# Patient Record
Sex: Male | Born: 1985
Health system: Southern US, Community
[De-identification: ages and names within clinical notes are randomized; demographics above are authoritative.]

## PROBLEM LIST (undated history)

## (undated) DIAGNOSIS — F32A Depression, unspecified: Secondary | ICD-10-CM

## (undated) DIAGNOSIS — F909 Attention-deficit hyperactivity disorder, unspecified type: Secondary | ICD-10-CM

## (undated) DIAGNOSIS — T7840XA Allergy, unspecified, initial encounter: Secondary | ICD-10-CM

## (undated) DIAGNOSIS — K219 Gastro-esophageal reflux disease without esophagitis: Secondary | ICD-10-CM

## (undated) DIAGNOSIS — I10 Essential (primary) hypertension: Secondary | ICD-10-CM

## (undated) HISTORY — DX: Gastro-esophageal reflux disease without esophagitis: K21.9

## (undated) HISTORY — PX: WISDOM TOOTH EXTRACTION: SHX21

## (undated) HISTORY — DX: Attention-deficit hyperactivity disorder, unspecified type: F90.9

## (undated) HISTORY — DX: Depression, unspecified: F32.A

## (undated) HISTORY — DX: Allergy, unspecified, initial encounter: T78.40XA

---

## 2004-07-27 HISTORY — PX: WISDOM TOOTH EXTRACTION: SHX21

## 2009-01-18 ENCOUNTER — Emergency Department (HOSPITAL_COMMUNITY): Admission: EM | Admit: 2009-01-18 | Discharge: 2009-01-18 | Payer: Self-pay | Admitting: Emergency Medicine

## 2011-07-28 DIAGNOSIS — I10 Essential (primary) hypertension: Secondary | ICD-10-CM

## 2011-07-28 HISTORY — DX: Essential (primary) hypertension: I10

## 2013-10-15 ENCOUNTER — Emergency Department (HOSPITAL_COMMUNITY): Payer: BC Managed Care – PPO

## 2013-10-15 ENCOUNTER — Emergency Department (HOSPITAL_COMMUNITY)
Admission: EM | Admit: 2013-10-15 | Discharge: 2013-10-16 | Disposition: A | Discharge status: Home / Self Care | Payer: BC Managed Care – PPO | Attending: Emergency Medicine | Admitting: Emergency Medicine

## 2013-10-15 ENCOUNTER — Encounter (HOSPITAL_COMMUNITY): Payer: Self-pay | Admitting: Emergency Medicine

## 2013-10-15 DIAGNOSIS — M545 Low back pain, unspecified: Secondary | ICD-10-CM | POA: Insufficient documentation

## 2013-10-15 DIAGNOSIS — IMO0001 Reserved for inherently not codable concepts without codable children: Secondary | ICD-10-CM | POA: Insufficient documentation

## 2013-10-15 DIAGNOSIS — R209 Unspecified disturbances of skin sensation: Secondary | ICD-10-CM | POA: Insufficient documentation

## 2013-10-15 DIAGNOSIS — I10 Essential (primary) hypertension: Secondary | ICD-10-CM | POA: Insufficient documentation

## 2013-10-15 DIAGNOSIS — R071 Chest pain on breathing: Secondary | ICD-10-CM | POA: Insufficient documentation

## 2013-10-15 DIAGNOSIS — Z79899 Other long term (current) drug therapy: Secondary | ICD-10-CM | POA: Insufficient documentation

## 2013-10-15 DIAGNOSIS — R42 Dizziness and giddiness: Secondary | ICD-10-CM | POA: Insufficient documentation

## 2013-10-15 DIAGNOSIS — R0789 Other chest pain: Secondary | ICD-10-CM

## 2013-10-15 DIAGNOSIS — IMO0002 Reserved for concepts with insufficient information to code with codable children: Secondary | ICD-10-CM | POA: Insufficient documentation

## 2013-10-15 HISTORY — DX: Essential (primary) hypertension: I10

## 2013-10-15 LAB — CBC
HCT: 43.5 % (ref 39.0–52.0)
HEMOGLOBIN: 15.2 g/dL (ref 13.0–17.0)
MCH: 31 pg (ref 26.0–34.0)
MCHC: 34.9 g/dL (ref 30.0–36.0)
MCV: 88.8 fL (ref 78.0–100.0)
PLATELETS: 217 10*3/uL (ref 150–400)
RBC: 4.9 MIL/uL (ref 4.22–5.81)
RDW: 12.1 % (ref 11.5–15.5)
WBC: 7.7 10*3/uL (ref 4.0–10.5)

## 2013-10-15 LAB — BASIC METABOLIC PANEL
BUN: 14 mg/dL (ref 6–23)
CALCIUM: 9.7 mg/dL (ref 8.4–10.5)
CO2: 27 meq/L (ref 19–32)
Chloride: 104 mEq/L (ref 96–112)
Creatinine, Ser: 0.8 mg/dL (ref 0.50–1.35)
GFR calc Af Amer: >90 mL/min (ref >90)
GFR calc non Af Amer: >90 mL/min (ref >90)
GLUCOSE: 92 mg/dL (ref 70–99)
POTASSIUM: 4.2 meq/L (ref 3.7–5.3)
SODIUM: 144 meq/L (ref 137–147)

## 2013-10-15 LAB — I-STAT TROPONIN, ED: TROPONIN I, POC: 0 ng/mL (ref 0.00–0.08)

## 2013-10-15 MED ORDER — KETOROLAC TROMETHAMINE 60 MG/2ML IM SOLN
60.0000 mg | Freq: Once | INTRAMUSCULAR | Status: AC
  Administered 2013-10-15: 60 mg via INTRAMUSCULAR
  Filled 2013-10-15: qty 2

## 2013-10-15 NOTE — ED Notes (Addendum)
Patient states he was having chest pain when he arrived, states it is mostly resolved at this time. Patient states he had a simialar episode last night and has had them in the remote past. Patient states pain is right sided, radiates to R arm and leg, he describes chest pain as pinching. States the arm and leg feel numb and cool. Patient reports low back pain and shoulder pain. Patient states he had a physical in the past week and everything was normal. Patient denies any prior evaluation for chest pain.

## 2013-10-16 MED ORDER — IBUPROFEN 600 MG PO TABS: 600.0000 mg | ORAL_TABLET | Freq: Three times a day (TID) | ORAL | Status: DC

## 2013-10-16 NOTE — ED Provider Notes (Signed)
CSN: [id number]     Arrival date & time 10/15/13  2016 History   First MD Initiated Contact with Patient 10/15/13 2306     Chief Complaint  Patient presents with  . Chest Pain    yesterday last about 1 hour, tonight started @2000      (Consider location/radiation/quality/duration/timing/severity/associated sxs/prior Treatment) HPI Patient is a 28 year old male who presents with right-sided chest pain starting this evening around 8 PM. He states he's had similar pain in the past. The back pain is mostly resolved at this time. He describes the pain as "pinching". He states he became anxious and then developed right arm tingling. He's had no shortness of breath. He denies any smoking history. He has no family history of coronary artery disease or thromboembolic disease. Patient is right-hand dominant.  Past Medical History  Diagnosis Date  . Hypertension     boarderline   Past Surgical History  Procedure Laterality Date  . Wisdom tooth extraction     No family history on file. History  Substance Use Topics  . Smoking status: Never Smoker   . Smokeless tobacco: Not on file  . Alcohol Use: Yes     Comment: social    Review of Systems  Constitutional: Negative for fever and chills.  Respiratory: Negative for cough and shortness of breath.   Cardiovascular: Positive for chest pain. Negative for palpitations and leg swelling.  Gastrointestinal: Negative for nausea, vomiting and abdominal pain.  Genitourinary: Negative for dysuria and flank pain.  Musculoskeletal: Positive for back pain and myalgias. Negative for neck pain and neck stiffness.  Skin: Negative for rash and wound.  Neurological: Positive for light-headedness. Negative for weakness, numbness and headaches.  All other systems reviewed and are negative.      Allergies  Amoxicillin  Home Medications   Current Outpatient Rx  Name  Route  Sig  Dispense  Refill  . fluticasone (FLONASE) 50 MCG/ACT nasal spray   Each  Nare   Place 1 spray into both nostrils daily as needed for allergies.          Marland Kitchen. ibuprofen (ADVIL,MOTRIN) 200 MG tablet   Oral   Take 600 mg by mouth every 8 (eight) hours as needed for headache or moderate pain.         . methylphenidate (RITALIN) 10 MG tablet   Oral   Take 1 tablet by mouth 2 (two) times daily.          BP 154/93  Pulse 85  Temp(Src) 97.7 F (36.5 C) (Oral)  Resp 16  Ht 5\' 9"  (1.753 m)  Wt 188 lb (85.276 kg)  BMI 27.75 kg/m2  SpO2 99% Physical Exam  Nursing note and vitals reviewed. Constitutional: He is oriented to person, place, and time. He appears well-developed and well-nourished. No distress.  Anxious appearing  HENT:  Head: Normocephalic and atraumatic.  Mouth/Throat: Oropharynx is clear and moist.  Eyes: EOM are normal. Pupils are equal, round, and reactive to light.  Neck: Normal range of motion. Neck supple.  No meningismus  Cardiovascular: Normal rate and regular rhythm.   Pulmonary/Chest: Effort normal and breath sounds normal. No respiratory distress. He has no wheezes. He has no rales. He exhibits tenderness (chest tenderness is completely reproduced with palpation of the right side of the sternal border and right pectoralis).  Abdominal: Soft. Bowel sounds are normal. He exhibits no distension and no mass. There is no tenderness. There is no rebound and no guarding.  Musculoskeletal: Normal range of  motion. He exhibits no edema and no tenderness.  Mild bilateral paraspinal lumbar tenderness.  Neurological: He is alert and oriented to person, place, and time.  Patient is alert and oriented x3 with clear, goal oriented speech. Patient has 5/5 motor in all extremities. Sensation is intact to light touch. Patient has a normal gait and walks without assistance.   Skin: Skin is warm and dry. No rash noted. No erythema.  Psychiatric:  Anxious    ED Course  Procedures (including critical care time) Labs Review Labs Reviewed  CBC  BASIC  METABOLIC PANEL  I-STAT TROPOININ, ED   Imaging Review No results found.   EKG Interpretation   Date/Time:  Sunday October 15 2013 20:59:15 EDT Ventricular Rate:  78 PR Interval:  140 QRS Duration: 97 QT Interval:  356 QTC Calculation: 405 R Axis:   60 Text Interpretation:  Sinus rhythm Confirmed by Andree Golphin  MD, Rosemary Pentecost  ([id number]) on 10/15/2013 11:11:34 PM      MDM   Final diagnoses:  None   Patient has no significant risk factors for from embolic disease or coronary artery disease. His pain is completely reproduced with palpation of the anterior chest and the right side. We'll treat his chest wall pain with anti-inflammatories. Patient encouraged to followup with his primary doctor regarding his mild elevated blood pressure. He states it always elevates when he is in a hospital setting. Return precautions have been given the patient voiced understanding.     Loren Racer, MD 10/16/13 [phone]

## 2013-10-16 NOTE — Discharge Instructions (Signed)

## 2014-03-19 ENCOUNTER — Encounter: Payer: Self-pay | Admitting: Family Medicine

## 2014-03-19 ENCOUNTER — Ambulatory Visit (INDEPENDENT_AMBULATORY_CARE_PROVIDER_SITE_OTHER): Payer: 59 | Admitting: Family Medicine

## 2014-03-19 ENCOUNTER — Ambulatory Visit: Payer: 59 | Admitting: Family Medicine

## 2014-03-19 VITALS — BP 138/100 | HR 73 | Temp 97.9°F | Resp 16 | Ht 70.0 in | Wt 194.6 lb

## 2014-03-19 DIAGNOSIS — I781 Nevus, non-neoplastic: Secondary | ICD-10-CM

## 2014-03-19 DIAGNOSIS — L309 Dermatitis, unspecified: Secondary | ICD-10-CM

## 2014-03-19 DIAGNOSIS — I10 Essential (primary) hypertension: Secondary | ICD-10-CM

## 2014-03-19 DIAGNOSIS — Z7189 Other specified counseling: Secondary | ICD-10-CM

## 2014-03-19 DIAGNOSIS — L259 Unspecified contact dermatitis, unspecified cause: Secondary | ICD-10-CM

## 2014-03-19 DIAGNOSIS — F909 Attention-deficit hyperactivity disorder, unspecified type: Secondary | ICD-10-CM

## 2014-03-19 DIAGNOSIS — Z7689 Persons encountering health services in other specified circumstances: Secondary | ICD-10-CM

## 2014-03-19 MED ORDER — LISINOPRIL 5 MG PO TABS: 5.0000 mg | ORAL_TABLET | Freq: Every day | ORAL | Status: DC

## 2014-03-19 MED ORDER — HYDROCORTISONE 2.5 % EX OINT: TOPICAL_OINTMENT | Freq: Two times a day (BID) | CUTANEOUS | Status: DC

## 2014-03-19 MED ORDER — METHYLPHENIDATE HCL 10 MG PO TABS: 10.0000 mg | ORAL_TABLET | Freq: Two times a day (BID) | ORAL | Status: DC

## 2014-03-19 NOTE — Patient Instructions (Signed)
DASH Eating Plan °DASH stands for "Dietary Approaches to Stop Hypertension." The DASH eating plan is a healthy eating plan that has been shown to reduce high blood pressure (hypertension). Additional health benefits may include reducing the risk of type 2 diabetes mellitus, heart disease, and stroke. The DASH eating plan may also help with weight loss. °WHAT DO I NEED TO KNOW ABOUT THE DASH EATING PLAN? °For the DASH eating plan, you will follow these general guidelines: °· Choose foods with a percent daily value for sodium of less than 5% (as listed on the food label). °· Use salt-free seasonings or herbs instead of table salt or sea salt. °· Check with your health care provider or pharmacist before using salt substitutes. °· Eat lower-sodium products, often labeled as "lower sodium" or "no salt added." °· Eat fresh foods. °· Eat more vegetables, fruits, and low-fat dairy products. °· Choose whole grains. Look for the word "whole" as the first word in the ingredient list. °· Choose fish and skinless chicken or turkey more often than red meat. Limit fish, poultry, and meat to 6 oz (170 g) each day. °· Limit sweets, desserts, sugars, and sugary drinks. °· Choose heart-healthy fats. °· Limit cheese to 1 oz (28 g) per day. °· Eat more home-cooked food and less restaurant, buffet, and fast food. °· Limit fried foods. °· Cook foods using methods other than frying. °· Limit canned vegetables. If you do use them, rinse them well to decrease the sodium. °· When eating at a restaurant, ask that your food be prepared with less salt, or no salt if possible. °WHAT FOODS CAN I EAT? °Seek help from a dietitian for individual calorie needs. °Grains °Whole grain or whole wheat bread. Brown rice. Whole grain or whole wheat pasta. Quinoa, bulgur, and whole grain cereals. Low-sodium cereals. Corn or whole wheat flour tortillas. Whole grain cornbread. Whole grain crackers. Low-sodium crackers. °Vegetables °Fresh or frozen vegetables  (raw, steamed, roasted, or grilled). Low-sodium or reduced-sodium tomato and vegetable juices. Low-sodium or reduced-sodium tomato sauce and paste. Low-sodium or reduced-sodium canned vegetables.  °Fruits °All fresh, canned (in natural juice), or frozen fruits. °Meat and Other Protein Products °Ground beef (85% or leaner), grass-fed beef, or beef trimmed of fat. Skinless chicken or turkey. Ground chicken or turkey. Pork trimmed of fat. All fish and seafood. Eggs. Dried beans, peas, or lentils. Unsalted nuts and seeds. Unsalted canned beans. °Dairy °Low-fat dairy products, such as skim or 1% milk, 2% or reduced-fat cheeses, low-fat ricotta or cottage cheese, or plain low-fat yogurt. Low-sodium or reduced-sodium cheeses. °Fats and Oils °Tub margarines without trans fats. Light or reduced-fat mayonnaise and salad dressings (reduced sodium). Avocado. Safflower, olive, or canola oils. Natural peanut or almond butter. °Other °Unsalted popcorn and pretzels. °The items listed above may not be a complete list of recommended foods or beverages. Contact your dietitian for more options. °WHAT FOODS ARE NOT RECOMMENDED? °Grains °White bread. White pasta. White rice. Refined cornbread. Bagels and croissants. Crackers that contain trans fat. °Vegetables °Creamed or fried vegetables. Vegetables in a cheese sauce. Regular canned vegetables. Regular canned tomato sauce and paste. Regular tomato and vegetable juices. °Fruits °Dried fruits. Canned fruit in light or heavy syrup. Fruit juice. °Meat and Other Protein Products °Fatty cuts of meat. Ribs, chicken wings, bacon, sausage, bologna, salami, chitterlings, fatback, hot dogs, bratwurst, and packaged luncheon meats. Salted nuts and seeds. Canned beans with salt. °Dairy °Whole or 2% milk, cream, half-and-half, and cream cheese. Whole-fat or sweetened yogurt. Full-fat   cheeses or blue cheese. Nondairy creamers and whipped toppings. Processed cheese, cheese spreads, or cheese  curds. °Condiments °Onion and garlic salt, seasoned salt, table salt, and sea salt. Canned and packaged gravies. Worcestershire sauce. Tartar sauce. Barbecue sauce. Teriyaki sauce. Soy sauce, including reduced sodium. Steak sauce. Fish sauce. Oyster sauce. Cocktail sauce. Horseradish. Ketchup and mustard. Meat flavorings and tenderizers. Bouillon cubes. Hot sauce. Tabasco sauce. Marinades. Taco seasonings. Relishes. °Fats and Oils °Butter, stick margarine, lard, shortening, ghee, and bacon fat. Coconut, palm kernel, or palm oils. Regular salad dressings. °Other °Pickles and olives. Salted popcorn and pretzels. °The items listed above may not be a complete list of foods and beverages to avoid. Contact your dietitian for more information. °WHERE CAN I FIND MORE INFORMATION? °National Heart, Lung, and Blood Institute: www.nhlbi.nih.gov/health/health-topics/topics/dash/ °Document Released: 07/02/2011 Document Revised: 11/27/2013 Document Reviewed: 05/17/2013 °ExitCare® Patient Information ©2015 ExitCare, LLC. This information is not intended to replace advice given to you by your health care provider. Make sure you discuss any questions you have with your health care provider. ° °

## 2014-03-19 NOTE — Progress Notes (Signed)
Subjective:    Patient ID: Patrick Graham, male    DOB: [birth year], 28 y.o.   MRN: [id number]  03/19/2014  Establish Care  HPI This 28 y.o. male presents for to establish care.    Last physical: 12/2013 at the [facility].   Colonoscopy: never TDAP:  UTD Influenza:  never Eye exam:  None recent; 2010; +glasses. Dental exam:  Last year.  ADHD: followed by local primary care physician at [facility].  Switched back to Ritalin due to lack of insurance; Adderall was working but Ritalin was cheaper.  Considering weaning self off of medication but just started working for the [employer] for [employer] so does not want to wean medication quite yet.  Followed by Dr. Ander Slade once yearly. Diagnosed with ADHD in middle school.  Denies side effects to medication.   Denies HA, insomnia, irritability. Emotionally doing well; denies depressive symptoms or anxiety.  Does now realize that blood pressure has been elevated for the past year since pt switched to Ritalin.  Allergic Rhinitis: taking Flonase for nasal congestion to help sleep to avoid snoring.  Recent weight loss which has also helped with snoring.    Pilonydal node/cyst:  Treated with abx recently; no need for surgical removal.   Last issue 1-2 months ago.  Went to [facility] health clinic for abx/Augmentin.    R hand rash:  Chemical irritation from previous job; has chronic scaling of R hand > L hand.  Excessive washing of hands.  Also scaling of hands worsens during winter months.  No excessive sweating of hands or feet.       Nevus stomach: one day in the shower, started bleeding one year ago.  No recurrent bleeding.  No change in shape or color.  No previous dermatology evaluation.  Elevated blood pressure: has been an issue for the past year.  Ranges from 130-160/80-100.  +Headaches twice weekly on average. No blurred vision, dizziness, paresthesias, focal weakness. Denies CP/palp/SOB/leg swelling.   Review of Systems  Constitutional:  Negative for fever, chills, diaphoresis, activity change, appetite change and fatigue.  Eyes: Negative for visual disturbance.  Respiratory: Negative for cough and shortness of breath.   Cardiovascular: Negative for chest pain, palpitations and leg swelling.  Endocrine: Negative for cold intolerance, heat intolerance, polydipsia, polyphagia and polyuria.  Skin: Negative for color change, rash and wound.  Neurological: Negative for dizziness, tremors, seizures, syncope, facial asymmetry, speech difficulty, weakness, light-headedness, numbness and headaches.  Psychiatric/Behavioral: Positive for decreased concentration. Negative for suicidal ideas, sleep disturbance, self-injury and dysphoric mood. The patient is not nervous/anxious.     Past Medical History  Diagnosis Date  . Hypertension 07/28/2011    borderline  . ADHD (attention deficit hyperactivity disorder)     diagnosed in middle school.  Previous use of Concerta, Adderall, Focalin  . Allergy    Past Surgical History  Procedure Laterality Date  . Wisdom tooth extraction     Allergies  Allergen Reactions  . Amoxicillin Nausea And Vomiting   Current Outpatient Prescriptions  Medication Sig Dispense Refill  . fluticasone (FLONASE) 50 MCG/ACT nasal spray Place 1 spray into both nostrils daily as needed for allergies.       Marland Kitchen ibuprofen (ADVIL,MOTRIN) 200 MG tablet Take 600 mg by mouth every 8 (eight) hours as needed for headache or moderate pain.      . hydrocortisone 2.5 % ointment Apply topically 2 (two) times daily.  30 g  1  . ibuprofen (ADVIL,MOTRIN) 600 MG tablet  Take 1 tablet (600 mg total) by mouth 3 (three) times daily after meals.  30 tablet  0  . lisinopril (PRINIVIL,ZESTRIL) 5 MG tablet Take 1 tablet (5 mg total) by mouth daily.  30 tablet  5  . methylphenidate (RITALIN) 10 MG tablet Take 1 tablet (10 mg total) by mouth 2 (two) times daily.  60 tablet  0   No current facility-administered medications for this visit.    History   Social History  . Marital Status: Single    Spouse Name: N/A    Number of Children: N/A  . Years of Education: N/A   Occupational History  . Not on file.   Social History Main Topics  . Smoking status: Never Smoker   . Smokeless tobacco: Not on file  . Alcohol Use: Yes     Comment: social  . Drug Use: No  . Sexual Activity: Not on file   Other Topics Concern  . Not on file   Social History Narrative   Marital status: single; not dating      Children:  None      Lives: with roommates; renting.      Employment:  [employer]; started in April 2015; likes job.      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      Tobacco; none      Alcohol:  Socially      Drugs: none      Exercise: none   Family History  Problem Relation Age of Onset  . Cancer Mother     endometrial cancer        Objective:    BP 138/100  Pulse 73  Temp(Src) 97.9 F (36.6 C) (Oral)  Resp 16  Ht  (1.778 m)  Wt 194 lb 9.6 oz (88.27 kg)  BMI 27.92 kg/m2  SpO2 97% Physical Exam  Nursing note and vitals reviewed. Constitutional: He is oriented to person, place, and time. He appears well-developed and well-nourished. No distress.  HENT:  Head: Normocephalic and atraumatic.  Right Ear: External ear normal.  Left Ear: External ear normal.  Nose: Nose normal.  Mouth/Throat: Oropharynx is clear and moist.  Eyes: Conjunctivae and EOM are normal. Pupils are equal, round, and reactive to light.  Neck: Normal range of motion. Neck supple. Carotid bruit is not present. No thyromegaly present.  Cardiovascular: Normal rate, regular rhythm, normal heart sounds and intact distal pulses.  Exam reveals no gallop and no friction rub.   No murmur heard. Pulmonary/Chest: Effort normal and breath sounds normal. He has no wheezes. He has no rales.  Abdominal: Soft. Bowel sounds are normal. He exhibits no distension and no mass. There is no tenderness. There is no rebound  and no guarding.  Lymphadenopathy:    He has no cervical adenopathy.  Neurological: He is alert and oriented to person, place, and time. No cranial nerve deficit.  Skin: Skin is warm and dry. No rash noted. He is not diaphoretic.  Scattered nevi without irregularity or color variation.  Psychiatric: He has a normal mood and affect. His behavior is normal.   Results for orders placed during the hospital encounter of 10/15/13  CBC      Result Value Ref Range   WBC 7.7  4.0 - 10.5 K/uL   RBC 4.90  4.22 - 5.81 MIL/uL   Hemoglobin 15.2  13.0 - 17.0 g/dL   HCT 56.2  13.0 - 86.5 %   MCV 88.8  78.0 -  100.0 fL   MCH 31.0  26.0 - 34.0 pg   MCHC 34.9  30.0 - 36.0 g/dL   RDW 16.1  09.6 - 04.5 %   Platelets 217  150 - 400 K/uL  BASIC METABOLIC PANEL      Result Value Ref Range   Sodium 144  137 - 147 mEq/L   Potassium 4.2  3.7 - 5.3 mEq/L   Chloride 104  96 - 112 mEq/L   CO2 27  19 - 32 mEq/L   Glucose, Bld 92  70 - 99 mg/dL   BUN 14  6 - 23 mg/dL   Creatinine, Ser 4.09  0.50 - 1.35 mg/dL   Calcium 9.7  8.4 - 81.1 mg/dL   GFR calc non Af Amer >90  >90 mL/min   GFR calc Af Amer >90  >90 mL/min  I-STAT TROPOININ, ED      Result Value Ref Range   Troponin i, poc 0.00  0.00 - 0.08 ng/mL   Comment 3                Assessment & Plan:   1. Nevus, non-neoplastic   2. Essential hypertension, benign   3. Dermatitis   4. Attention deficit hyperactivity disorder (ADHD), unspecified ADHD type   5. Establishing care with new doctor, encounter for    1. ADHD: controlled; refill of Ritalin provided.  RTC one month. 2.  HTN: new onset; obtain recent labs from appointment with previous PCP in 12/2013.  Rx for Lisinopril  daily provided; obtain u/a, CMET at next visit. Follow-up in six weeks for repeat BP. 3.  Contact Dermatitis Hands B: New. Rx for hydrocortisone ointment 2.5% bid. 4.  Nevus multiple:  New. Refer to dermatology for skin survey of various nevi. 5. Establish care: reviewed health  history in detail during visit.  Meds ordered this encounter  Medications  . lisinopril (PRINIVIL,ZESTRIL) 5 MG tablet    Sig: Take 1 tablet (5 mg total) by mouth daily.    Dispense:  30 tablet    Refill:  5  . methylphenidate (RITALIN) 10 MG tablet    Sig: Take 1 tablet (10 mg total) by mouth 2 (two) times daily.    Dispense:  60 tablet    Refill:  0  . hydrocortisone 2.5 % ointment    Sig: Apply topically 2 (two) times daily.    Dispense:  30 g    Refill:  1    Return in about 6 weeks (around 04/30/2014).    Nilda Simmer, M.D.  [facility] [address] [place], [place]  [address] [phone] phone [phone] fax

## 2014-06-18 ENCOUNTER — Telehealth: Payer: Self-pay

## 2014-06-18 NOTE — Telephone Encounter (Signed)
Patient needs to pick up written prescription for Ritalin 10 mg  Please call him at [phone] when ready to pick up.

## 2014-06-19 NOTE — Telephone Encounter (Signed)
Patient was to follow-up with me in 04/2014 to follow-up on blood pressure; I do not see a scheduled appointment.

## 2014-06-19 NOTE — Telephone Encounter (Signed)
Lm for pt to rtn call to schedule appt.

## 2014-06-24 ENCOUNTER — Ambulatory Visit (INDEPENDENT_AMBULATORY_CARE_PROVIDER_SITE_OTHER): Payer: 59

## 2014-06-24 ENCOUNTER — Ambulatory Visit (INDEPENDENT_AMBULATORY_CARE_PROVIDER_SITE_OTHER): Payer: 59 | Admitting: Family Medicine

## 2014-06-24 VITALS — BP 122/80 | HR 76 | Temp 97.9°F | Resp 16 | Ht 70.0 in | Wt 200.8 lb

## 2014-06-24 DIAGNOSIS — I10 Essential (primary) hypertension: Secondary | ICD-10-CM | POA: Insufficient documentation

## 2014-06-24 DIAGNOSIS — R0781 Pleurodynia: Secondary | ICD-10-CM

## 2014-06-24 DIAGNOSIS — F901 Attention-deficit hyperactivity disorder, predominantly hyperactive type: Secondary | ICD-10-CM

## 2014-06-24 DIAGNOSIS — S20211A Contusion of right front wall of thorax, initial encounter: Secondary | ICD-10-CM

## 2014-06-24 LAB — CBC WITH DIFFERENTIAL/PLATELET
BASOS ABS: 0.1 10*3/uL (ref 0.0–0.1)
BASOS PCT: 1 % (ref 0–1)
Eosinophils Absolute: 0.9 10*3/uL — ABNORMAL HIGH (ref 0.0–0.7)
Eosinophils Relative: 14 % — ABNORMAL HIGH (ref 0–5)
HEMATOCRIT: 46.7 % (ref 39.0–52.0)
HEMOGLOBIN: 16.2 g/dL (ref 13.0–17.0)
LYMPHS PCT: 31 % (ref 12–46)
Lymphs Abs: 2 10*3/uL (ref 0.7–4.0)
MCH: 30.7 pg (ref 26.0–34.0)
MCHC: 34.7 g/dL (ref 30.0–36.0)
MCV: 88.4 fL (ref 78.0–100.0)
MONO ABS: 0.4 10*3/uL (ref 0.1–1.0)
MONOS PCT: 7 % (ref 3–12)
MPV: 8.9 fL — AB (ref 9.4–12.4)
NEUTROS ABS: 3 10*3/uL (ref 1.7–7.7)
NEUTROS PCT: 47 % (ref 43–77)
Platelets: 270 10*3/uL (ref 150–400)
RBC: 5.28 MIL/uL (ref 4.22–5.81)
RDW: 12.7 % (ref 11.5–15.5)
WBC: 6.3 10*3/uL (ref 4.0–10.5)

## 2014-06-24 LAB — COMPREHENSIVE METABOLIC PANEL
ALBUMIN: 4.6 g/dL (ref 3.5–5.2)
ALK PHOS: 61 U/L (ref 39–117)
ALT: 17 U/L (ref 0–53)
AST: 13 U/L (ref 0–37)
BILIRUBIN TOTAL: 0.5 mg/dL (ref 0.2–1.2)
BUN: 13 mg/dL (ref 6–23)
CO2: 28 mEq/L (ref 19–32)
Calcium: 9.4 mg/dL (ref 8.4–10.5)
Chloride: 104 mEq/L (ref 96–112)
Creat: 0.8 mg/dL (ref 0.50–1.35)
Glucose, Bld: 107 mg/dL — ABNORMAL HIGH (ref 70–99)
POTASSIUM: 4.2 meq/L (ref 3.5–5.3)
SODIUM: 139 meq/L (ref 135–145)
TOTAL PROTEIN: 6.9 g/dL (ref 6.0–8.3)

## 2014-06-24 LAB — TSH: TSH: 1.336 u[IU]/mL (ref 0.350–4.500)

## 2014-06-24 LAB — LIPID PANEL
CHOL/HDL RATIO: 2.7 ratio
Cholesterol: 116 mg/dL (ref 0–200)
HDL: 43 mg/dL (ref >39)
LDL CALC: 57 mg/dL (ref 0–99)
Triglycerides: 82 mg/dL (ref <150)
VLDL: 16 mg/dL (ref 0–40)

## 2014-06-24 MED ORDER — METHYLPHENIDATE HCL 10 MG PO TABS: 10.0000 mg | ORAL_TABLET | Freq: Two times a day (BID) | ORAL | Status: DC

## 2014-06-24 NOTE — Patient Instructions (Signed)
Rib Contusion °A rib contusion (bruise) can occur by a blow to the chest or by a fall against a hard object. Usually these will be much better in a couple weeks. If X-rays were taken today and there are no broken bones (fractures), the diagnosis of bruising is made. However, broken ribs may not show up for several days, or may be discovered later on a routine X-ray when signs of healing show up. If this happens to you, it does not mean that something was missed on the X-ray, but simply that it did not show up on the first X-rays. Earlier diagnosis will not usually change the treatment. °HOME CARE INSTRUCTIONS  °· Avoid strenuous activity. Be careful during activities and avoid bumping the injured ribs. Activities that pull on the injured ribs and cause pain should be avoided, if possible. °· For the first day or two, an ice pack used every 20 minutes while awake may be helpful. Put ice in a plastic bag and put a towel between the bag and the skin. °· Eat a normal, well-balanced diet. Drink plenty of fluids to avoid constipation. °· Take deep breaths several times a day to keep lungs free of infection. Try to cough several times a day. Splint the injured area with a pillow while coughing to ease pain. Coughing can help prevent pneumonia. °· Wear a rib belt or binder only if told to do so by your caregiver. If you are wearing a rib belt or binder, you must do the breathing exercises as directed by your caregiver. If not used properly, rib belts or binders restrict breathing which can lead to pneumonia. °· Only take over-the-counter or prescription medicines for pain, discomfort, or fever as directed by your caregiver. °SEEK MEDICAL CARE IF:  °· You or your child has an oral temperature above 102° F (38.9° C). °· Your baby is older than 3 months with a rectal temperature of 100.5° F (38.1° C) or higher for more than 1 day. °· You develop a cough, with thick or bloody sputum. °SEEK IMMEDIATE MEDICAL CARE IF:  °· You  have difficulty breathing. °· You feel sick to your stomach (nausea), have vomiting or belly (abdominal) pain. °· You have worsening pain, not controlled with medications, or there is a change in the location of the pain. °· You develop sweating or radiation of the pain into the arms, jaw or shoulders, or become light headed or faint. °· You or your child has an oral temperature above 102° F (38.9° C), not controlled by medicine. °· Your or your baby is older than 3 months with a rectal temperature of 102° F (38.9° C) or higher. °· Your baby is 3 months old or younger with a rectal temperature of 100.4° F (38° C) or higher. °MAKE SURE YOU:  °· Understand these instructions. °· Will watch your condition. °· Will get help right away if you are not doing well or get worse. °Document Released: 04/07/2001 Document Revised: 11/07/2012 Document Reviewed: 02/29/2008 °ExitCare® Patient Information ©2015 [employer]. This information is not intended to replace advice given to you by your health care provider. Make sure you discuss any questions you have with your health care provider. ° °

## 2014-06-24 NOTE — Progress Notes (Addendum)
Subjective:    Patient ID: Patrick Graham, male    DOB: 01/20/1986, 28 y.o.   MRN: [id number]  HPI Chief Complaint  Patient presents with   Rib Injury    x 7 days   This chart was scribed for Nilda SimmerKristi Smith, MD by Andrew Auaven Small, ED Scribe. This patient was seen in room 1 and the patient's care was started at 9:24 AM.  HPI Comments: Patrick Graham is a 28 y.o. male who presents to the [facility] complaining of a rib injury x 7 days ago. Pt established care with me in August. He was prescribed Ritalin for ADHD and was started on lisinopril 5mg   for HTN. Pt was referred to dermatology for multiple nevi and was prescribed a hydrocortisone cream for dermatitis of hands. Pt called for a refill of Ritalin on 06/18/14 but was told he had to be seen. Pt is tolerating Ritalin well and only takes medication when working. 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 Pt has been taking lisinopril and reports he is no longer having headaches. He denies checking BP at home. Pt denies exercise.  Dermatologist removed one nevi with negative pathology.  Pt c/o unchanged right rib injury that occurred 8 days ago. Pt states he dropped a hand truck on his chest and now has right rib pain worse with movement and deep breaths. Pt also has pain with sleeping.  Pt has taken 400- 800mg  ibuprofen with relief. Pt denies SOB, cough, fever, chills, sweats, abdominal pain, nausea, emesis, and hematuria.     Past Medical History  Diagnosis Date   Hypertension 07/28/2011    borderline   ADHD (attention deficit hyperactivity disorder)     diagnosed in middle school.  Previous use of Concerta, Adderall, Focalin   Allergy    Allergies  Allergen Reactions   Amoxicillin Nausea And Vomiting   Prior to Admission medications   Medication Sig Start Date End Date Taking? Authorizing Provider  fluticasone (FLONASE) 50 MCG/ACT nasal spray Place 1 spray into both nostrils  daily as needed for allergies.  09/18/13  Yes Historical Provider, MD  ibuprofen (ADVIL,MOTRIN) 200 MG tablet Take 600 mg by mouth every 8 (eight) hours as needed for headache or moderate pain.   Yes Historical Provider, MD  ibuprofen (ADVIL,MOTRIN) 600 MG tablet Take 1 tablet (600 mg total) by mouth 3 (three) times daily after meals. 10/16/13  Yes Loren Raceravid Yelverton, MD  lisinopril (PRINIVIL,ZESTRIL) 5 MG tablet Take 1 tablet (5 mg total) by mouth daily. 03/19/14  Yes Ethelda ChickKristi M Smith, MD  methylphenidate (RITALIN) 10 MG tablet Take 1 tablet (10 mg total) by mouth 2 (two) times daily. 03/19/14  Yes Ethelda ChickKristi M Smith, MD  hydrocortisone 2.5 % ointment Apply topically 2 (two) times daily. Patient not taking: Reported on 06/24/2014 03/19/14   Ethelda ChickKristi M Smith, MD   Review of Systems  Constitutional: Negative for fever, chills, diaphoresis and fatigue.  Respiratory: Negative for cough and shortness of breath.   Cardiovascular: Positive for chest pain. Negative for palpitations and leg swelling.  Gastrointestinal: Negative for nausea, vomiting and abdominal pain.  Genitourinary: Negative for hematuria.  Musculoskeletal: Positive for myalgias and arthralgias. Negative for neck pain and neck stiffness.  Skin: Negative for rash.  Neurological: Negative for dizziness, tremors, seizures, syncope, facial asymmetry, speech difficulty, weakness, light-headedness, numbness and headaches.  Psychiatric/Behavioral: Positive for decreased concentration. Negative for suicidal ideas, sleep disturbance, self-injury and dysphoric mood. The patient  is not nervous/anxious.    Objective:   Physical Exam  Constitutional: He is oriented to person, place, and time. He appears well-developed and well-nourished. No distress.  HENT:  Head: Normocephalic and atraumatic.  Right Ear: External ear normal.  Left Ear: External ear normal.  Nose: Nose normal.  Mouth/Throat: Oropharynx is clear and moist.  Eyes: Conjunctivae and EOM are  normal. Pupils are equal, round, and reactive to light.  Neck: Normal range of motion. Neck supple. Carotid bruit is not present. No thyromegaly present.  Cardiovascular: Normal rate, regular rhythm, normal heart sounds and intact distal pulses.  Exam reveals no gallop and no friction rub.   No murmur heard. Pulmonary/Chest: Effort normal and breath sounds normal. He has no wheezes. He has no rales.  +TTP right anterior ribs medially anteriorly. + Pain to anterior ribs with ROM of lumbar spine.   Abdominal: Soft. Bowel sounds are normal. He exhibits no distension and no mass. There is no tenderness. There is no rebound and no guarding.  Musculoskeletal: Normal range of motion.  Full ROM of cervical spine without pain.  Lymphadenopathy:    He has no cervical adenopathy.  Neurological: He is alert and oriented to person, place, and time. No cranial nerve deficit. He exhibits abnormal muscle tone. Coordination normal.  Skin: Skin is warm and dry. No rash noted. He is not diaphoretic.  Psychiatric: He has a normal mood and affect. His behavior is normal.  Nursing note and vitals reviewed.  Filed Vitals:   06/24/14 0922  BP: 122/80  Pulse: 76  Temp: 97.9 F (36.6 C)  Resp: 16  , [facility] reading (PRIMARY) by Dr. Katrinka BlazingSmith. R RIB FILMS: NAD  Assessment & Plan:   Rib pain on right side - Plan: DG Ribs Unilateral W/Chest Right  Essential hypertension, benign - Plan: CBC with Differential, Comprehensive metabolic panel, TSH, Lipid panel  Attention-deficit hyperactivity disorder, predominantly hyperactive type  Rib contusion, right, initial encounter    1. R rib contusion/pain: New.  Continue Ibuprofen 800mg  tid scheduled; avoid heavy lifting for the next two weeks. 2.  HTN: controlled; obtain labs; continue Lisinopril 5mg  daily; recommend weekly exercise. 3.  ADHD: controlled; refill of Ritalin provided; pt desires to decrease dose to 5mg  bid; will decrease dose and observe attention and work  performance.  Follow-up three months. 4.  Multiple nevi: stable; s/p dermatology evaluation; benign pathology. 5.  Hand dermatitis: improved; s/p steroid cream; dermatologist recommended stopping steroid and starting barrier cream.   Meds ordered this encounter  Medications   methylphenidate (RITALIN) 10 MG tablet    Sig: Take 1 tablet (10 mg total) by mouth 2 (two) times daily.    Dispense:  60 tablet    Refill:  0    I personally performed the services described in this documentation, which was scribed in my presence. The recorded information has been reviewed and considered.   Nilda SimmerKristi Smith, M.D.  [facility]   [place] [address] [place], [place]  [id number] [phone] phone [phone] fax

## 2014-07-02 NOTE — Progress Notes (Signed)
LMVM to CB to schedule a follow up appt with Dr. Katrinka BlazingSmith in 3 months.

## 2014-07-06 ENCOUNTER — Telehealth: Payer: Self-pay | Admitting: Family Medicine

## 2014-07-06 NOTE — Telephone Encounter (Signed)
LMOM to CB. 

## 2014-07-06 NOTE — Telephone Encounter (Signed)
-----   Message from Patrick ChickKristi M Smith, MD sent at 06/24/2014 10:58 AM EST ----- Please call to schedule appointment in three months.

## 2014-09-23 ENCOUNTER — Other Ambulatory Visit: Payer: Self-pay | Admitting: Family Medicine

## 2014-09-26 ENCOUNTER — Encounter: Payer: Self-pay | Admitting: Family Medicine

## 2014-09-26 ENCOUNTER — Ambulatory Visit (INDEPENDENT_AMBULATORY_CARE_PROVIDER_SITE_OTHER): Payer: 59 | Admitting: Family Medicine

## 2014-09-26 VITALS — BP 132/82 | HR 73 | Temp 97.7°F | Resp 16 | Ht 69.5 in | Wt 207.0 lb

## 2014-09-26 DIAGNOSIS — F900 Attention-deficit hyperactivity disorder, predominantly inattentive type: Secondary | ICD-10-CM | POA: Diagnosis not present

## 2014-09-26 DIAGNOSIS — Z23 Encounter for immunization: Secondary | ICD-10-CM

## 2014-09-26 DIAGNOSIS — I10 Essential (primary) hypertension: Secondary | ICD-10-CM

## 2014-09-26 DIAGNOSIS — J392 Other diseases of pharynx: Secondary | ICD-10-CM | POA: Diagnosis not present

## 2014-09-26 DIAGNOSIS — Z7251 High risk heterosexual behavior: Secondary | ICD-10-CM | POA: Diagnosis not present

## 2014-09-26 LAB — POCT URINALYSIS DIPSTICK
Bilirubin, UA: NEGATIVE
Glucose, UA: NEGATIVE
KETONES UA: NEGATIVE
Leukocytes, UA: NEGATIVE
Nitrite, UA: NEGATIVE
PH UA: 6.5
PROTEIN UA: NEGATIVE
RBC UA: NEGATIVE
SPEC GRAV UA: 1.025
UROBILINOGEN UA: 0.2

## 2014-09-26 LAB — COMPREHENSIVE METABOLIC PANEL
ALK PHOS: 58 U/L (ref 39–117)
ALT: 19 U/L (ref 0–53)
AST: 16 U/L (ref 0–37)
Albumin: 4.6 g/dL (ref 3.5–5.2)
BILIRUBIN TOTAL: 0.9 mg/dL (ref 0.2–1.2)
BUN: 13 mg/dL (ref 6–23)
CO2: 27 mEq/L (ref 19–32)
CREATININE: 0.8 mg/dL (ref 0.50–1.35)
Calcium: 9.7 mg/dL (ref 8.4–10.5)
Chloride: 107 mEq/L (ref 96–112)
GLUCOSE: 87 mg/dL (ref 70–99)
Potassium: 4.2 mEq/L (ref 3.5–5.3)
Sodium: 140 mEq/L (ref 135–145)
Total Protein: 7.2 g/dL (ref 6.0–8.3)

## 2014-09-26 LAB — RPR

## 2014-09-26 LAB — HIV ANTIBODY (ROUTINE TESTING W REFLEX): HIV: NONREACTIVE

## 2014-09-26 MED ORDER — METHYLPHENIDATE HCL 10 MG PO TABS
10.0000 mg | ORAL_TABLET | Freq: Two times a day (BID) | ORAL | Status: DC
Start: 2014-09-26 — End: 2014-09-26

## 2014-09-26 MED ORDER — METHYLPHENIDATE HCL 10 MG PO TABS: 10.0000 mg | ORAL_TABLET | Freq: Two times a day (BID) | ORAL | Status: DC

## 2014-09-26 NOTE — Patient Instructions (Signed)
1.  Recommend  Zyrtec 10mg  one tablet daily (one month supply of Zyrtec). 2.  Zantac 150mg  one tablet twice daily (one month supply). 3. Call in two months if scratchy throat has not improved.

## 2014-09-26 NOTE — Progress Notes (Signed)
Subjective:    Patient ID: Patrick Graham, male    DOB: Oct 19, 1985, 29 y.o.   MRN: [id number]  09/26/2014  Follow-up; Hypertension; and ADHD   HPI This 29 y.o. male presents for three month follow-up for the following:  1. HTN: Patient reports good compliance with medication, good tolerance to medication, and good symptom control.   Does not check at home.    2.  ADHD:  Almost out of Ritalin  bid; has been 1/2ing tablets; taking 1/2 tablet once daily for past week.  Has four remaining tablets.  Work performance is going well.  Started job 10/2013; part was training through May.  No stressors.  Sleeping well.  Has some insomnia.  No regular sleep pattern.  Wakes up at 5:00am for work.  Bedtime 9:00 or 10:00.   No major stressors. Emotionally doing well.  3.  Scratchy throat: persistent.  Several months. Causes a cough.  Unknown duration.  +nasal congestion; no rhinorrhea.  Taking Flonase daily at bedtime.  Having some heartburn; eating snacks.  Heartburn has been for two weeks.  History of severe GERD; weight loss has helped.  Mild nasal congestion on L.  Tried taking mucous relief without improvement; took for one month; Mucinex.  Was taking Zyrtec prior to Flonase.  4.  STD screening: requesting STD screening; no previous screening and currently sexually active.  Male partners only.  No history of STDs.    5. Agreeable to flu vaccine; prefers to wait on TDAP.   Review of Systems  Constitutional: Negative for fever, chills, diaphoresis, activity change, appetite change and fatigue.  HENT: Positive for congestion and postnasal drip. Negative for ear pain, rhinorrhea, sinus pressure, sneezing, sore throat, trouble swallowing and voice change.   Eyes: Negative for visual disturbance.  Respiratory: Positive for cough. Negative for shortness of breath.   Cardiovascular: Negative for chest pain, palpitations and leg swelling.  Gastrointestinal: Negative for nausea, vomiting, abdominal  pain and diarrhea.  Endocrine: Negative for cold intolerance, heat intolerance, polydipsia, polyphagia and polyuria.  Genitourinary: Negative for dysuria, urgency, discharge, penile swelling, scrotal swelling, genital sores, penile pain and testicular pain.  Neurological: Negative for dizziness, tremors, seizures, syncope, facial asymmetry, speech difficulty, weakness, light-headedness, numbness and headaches.  Psychiatric/Behavioral: Positive for sleep disturbance and decreased concentration. Negative for suicidal ideas, self-injury and dysphoric mood. The patient is not nervous/anxious and is not hyperactive.     Past Medical History  Diagnosis Date  . Hypertension 07/28/2011    borderline  . ADHD (attention deficit hyperactivity disorder)     diagnosed in middle school.  Previous use of Concerta, Adderall, Focalin  . Allergy    Past Surgical History  Procedure Laterality Date  . Wisdom tooth extraction     Allergies  Allergen Reactions  . Amoxicillin Nausea And Vomiting   Current Outpatient Prescriptions  Medication Sig Dispense Refill  . fluticasone (FLONASE) 50 MCG/ACT nasal spray Place 1 spray into both nostrils daily as needed for allergies.     Marland Kitchen ibuprofen (ADVIL,MOTRIN) 200 MG tablet Take 600 mg by mouth every 8 (eight) hours as needed for headache or moderate pain.    Marland Kitchen ibuprofen (ADVIL,MOTRIN) 600 MG tablet Take 1 tablet (600 mg total) by mouth 3 (three) times daily after meals. 30 tablet 0  . lisinopril (PRINIVIL,ZESTRIL) 5 MG tablet TAKE 1 TABLET BY MOUTH EVERY DAY. 30 tablet 0  . methylphenidate (RITALIN) 10 MG tablet Take 1 tablet (10 mg total) by mouth 2 (two) times daily. 60  tablet 0  . hydrocortisone 2.5 % ointment Apply topically 2 (two) times daily. (Patient not taking: Reported on 06/24/2014) 30 g 1   No current facility-administered medications for this visit.       Objective:    BP 132/82 mmHg  Pulse 73  Temp(Src) 97.7 F (36.5 C) (Oral)  Resp 16  Ht 5'  9.5" (1.765 m)  Wt 207 lb (93.895 kg)  BMI 30.14 kg/m2  SpO2 96% Physical Exam  Constitutional: He is oriented to person, place, and time. He appears well-developed and well-nourished. No distress.  HENT:  Head: Normocephalic and atraumatic.  Right Ear: External ear normal.  Left Ear: External ear normal.  Nose: Nose normal.  Mouth/Throat: Oropharynx is clear and moist.  Eyes: Conjunctivae and EOM are normal. Pupils are equal, round, and reactive to light.  Neck: Normal range of motion. Neck supple. Carotid bruit is not present. No thyromegaly present.  Cardiovascular: Normal rate, regular rhythm, normal heart sounds and intact distal pulses.  Exam reveals no gallop and no friction rub.   No murmur heard. Pulmonary/Chest: Effort normal and breath sounds normal. He has no wheezes. He has no rales.  Abdominal: Soft. Bowel sounds are normal. He exhibits no distension and no mass. There is no tenderness. There is no rebound and no guarding.  Lymphadenopathy:    He has no cervical adenopathy.  Neurological: He is alert and oriented to person, place, and time. No cranial nerve deficit.  Skin: Skin is warm and dry. No rash noted. He is not diaphoretic.  Psychiatric: He has a normal mood and affect. His behavior is normal. Judgment and thought content normal.  Nursing note and vitals reviewed.  INFLUENZA VACCINE ADMINISTERED.     Assessment & Plan:   1. Attention deficit hyperactivity disorder (ADHD), predominantly inattentive type   2. Essential hypertension, benign   3. High risk sexual behavior   4. Throat irritation   5. Need for prophylactic vaccination and inoculation against influenza     1. ADHD: controlled; refills of Ritalin 10mg  bid provided; can attempt to wean to 5mg  bid over next three months; RTC six months; call in three months for 3 more refills. 2.  HTN: controlled; obtain labs; continue current medication; recommend checking BP at home once weekly. 3.  Throat  irritation:  Persistent despite Flonase daily; add Zyrtec 10mg  daily for one month; if throat irritation persists with Zyrtec and Flonase, add Zantac 150mg  bid for next month.  Call in two months if throat irritation persistent despite Flonase, Zyrtec, and Zantac. 4. High risk sexual behavior: New. Asymptomatic; obtain GC/Chlam/RPR/HIV. Counseling provided. 5. S/p flu vaccine.  Meds ordered this encounter  Medications  . DISCONTD: methylphenidate (RITALIN) 10 MG tablet    Sig: Take 1 tablet (10 mg total) by mouth 2 (two) times daily.    Dispense:  60 tablet    Refill:  0  . DISCONTD: methylphenidate (RITALIN) 10 MG tablet    Sig: Take 1 tablet (10 mg total) by mouth 2 (two) times daily.    Dispense:  60 tablet    Refill:  0    DO NOT FILL UNTIL 10-28-2014  . methylphenidate (RITALIN) 10 MG tablet    Sig: Take 1 tablet (10 mg total) by mouth 2 (two) times daily.    Dispense:  60 tablet    Refill:  0    DO NOT FILL UNTIL 11-27-2014    Return in about 6 months (around 03/29/2015) for recheck.  Loleta Frommelt Elayne Guerin, M.D. [facility] [address] [place], [place]  [address] [phone] phone [phone] fax

## 2014-09-27 LAB — GC/CHLAMYDIA PROBE AMP
CT PROBE, AMP APTIMA: NEGATIVE
GC Probe RNA: NEGATIVE

## 2014-10-19 ENCOUNTER — Other Ambulatory Visit: Payer: Self-pay | Admitting: Physician Assistant

## 2015-04-10 ENCOUNTER — Ambulatory Visit: Payer: 59 | Admitting: Family Medicine

## 2015-04-12 ENCOUNTER — Ambulatory Visit: Payer: 59 | Admitting: Family Medicine

## 2015-04-12 ENCOUNTER — Telehealth: Payer: Self-pay | Admitting: *Deleted

## 2015-04-12 NOTE — Telephone Encounter (Signed)
Called patient because he did not show for his appointment at 10:00 am. Left message in voice mail of cell phone.

## 2015-05-31 ENCOUNTER — Encounter: Payer: Self-pay | Admitting: Family Medicine

## 2015-05-31 ENCOUNTER — Other Ambulatory Visit: Payer: Self-pay | Admitting: Family Medicine

## 2015-05-31 ENCOUNTER — Ambulatory Visit (INDEPENDENT_AMBULATORY_CARE_PROVIDER_SITE_OTHER): Payer: 59 | Admitting: Family Medicine

## 2015-05-31 VITALS — BP 124/88 | HR 86 | Temp 98.3°F | Resp 16 | Ht 70.0 in | Wt 222.0 lb

## 2015-05-31 DIAGNOSIS — R198 Other specified symptoms and signs involving the digestive system and abdomen: Secondary | ICD-10-CM | POA: Diagnosis not present

## 2015-05-31 DIAGNOSIS — I10 Essential (primary) hypertension: Secondary | ICD-10-CM

## 2015-05-31 DIAGNOSIS — Z23 Encounter for immunization: Secondary | ICD-10-CM | POA: Diagnosis not present

## 2015-05-31 DIAGNOSIS — F901 Attention-deficit hyperactivity disorder, predominantly hyperactive type: Secondary | ICD-10-CM

## 2015-05-31 DIAGNOSIS — R6889 Other general symptoms and signs: Secondary | ICD-10-CM

## 2015-05-31 DIAGNOSIS — Z5181 Encounter for therapeutic drug level monitoring: Secondary | ICD-10-CM

## 2015-05-31 DIAGNOSIS — R0989 Other specified symptoms and signs involving the circulatory and respiratory systems: Secondary | ICD-10-CM

## 2015-05-31 MED ORDER — LISINOPRIL 10 MG PO TABS: 10.0000 mg | ORAL_TABLET | Freq: Every day | ORAL | Status: DC

## 2015-05-31 MED ORDER — FLUTICASONE PROPIONATE 50 MCG/ACT NA SUSP: 2.0000 | Freq: Every day | NASAL | Status: DC

## 2015-05-31 MED ORDER — METHYLPHENIDATE HCL 10 MG PO TABS: 10.0000 mg | ORAL_TABLET | Freq: Two times a day (BID) | ORAL | Status: DC

## 2015-05-31 NOTE — Patient Instructions (Signed)
1. Restart Flonase, Zyrtec, and Zantac daily.

## 2015-05-31 NOTE — Progress Notes (Signed)
Subjective:    Patient ID: Patrick Graham, [birth year], 29 y.o.   MRN: [id number]  05/31/2015  Follow-up and Hypertension   HPI This 29 y.o. male presents for seven month follow-up:  1.  Throat itching:  Persistent.  Worried about throat or lung cancer.  Smoked pipe tobacco for 2 years but very sparingly.  Smoked marijuana for a while. Flonase the whole time; Zyrtec for one month; Zantac once daily.    2.  HTN: Patient reports good compliance with medication, good tolerance to medication, and good symptom control.    3.  ADD: taking Ritalin much less.  Only filled one of them.     Review of Systems  Constitutional: Negative for fever, chills, diaphoresis, activity change, appetite change and fatigue.  HENT: Negative for congestion, ear discharge, ear pain, postnasal drip, rhinorrhea, sinus pressure, sore throat, tinnitus, trouble swallowing and voice change.   Eyes: Negative for visual disturbance.  Respiratory: Negative for cough and shortness of breath.   Cardiovascular: Negative for chest pain, palpitations and leg swelling.  Endocrine: Negative for cold intolerance, heat intolerance, polydipsia, polyphagia and polyuria.  Neurological: Negative for dizziness, tremors, seizures, syncope, facial asymmetry, speech difficulty, weakness, light-headedness, numbness and headaches.  Psychiatric/Behavioral: Positive for decreased concentration. Negative for suicidal ideas, sleep disturbance, self-injury and dysphoric mood. The patient is not nervous/anxious.     Past Medical History  Diagnosis Date  . Hypertension 07/28/2011    borderline  . ADHD (attention deficit hyperactivity disorder)     diagnosed in middle school.  Previous use of Concerta, Adderall, Focalin  . Allergy    Past Surgical History  Procedure Laterality Date  . Wisdom tooth extraction     Allergies  Allergen Reactions  . Amoxicillin Nausea And Vomiting   Current Outpatient Prescriptions    Medication Sig Dispense Refill  . ibuprofen (ADVIL,MOTRIN) 200 MG tablet Take 600 mg by mouth every 8 (eight) hours as needed for headache or moderate pain.    Marland Kitchen ibuprofen (ADVIL,MOTRIN) 600 MG tablet Take 1 tablet (600 mg total) by mouth 3 (three) times daily after meals. 30 tablet 0  . lisinopril (PRINIVIL,ZESTRIL) 10 MG tablet Take 1 tablet (10 mg total) by mouth daily. 90 tablet 1  . methylphenidate (RITALIN) 10 MG tablet Take 1 tablet (10 mg total) by mouth 2 (two) times daily. 60 tablet 0  . fluticasone (FLONASE) 50 MCG/ACT nasal spray Place 2 sprays into both nostrils daily. 16 g 11   No current facility-administered medications for this visit.   Social History   Social History  . Marital Status: Single    Spouse Name: N/A  . Number of Children: N/A  . Years of Education: N/A   Occupational History  . Not on file.   Social History Main Topics  . Smoking status: Never Smoker   . Smokeless tobacco: Not on file  . Alcohol Use: Yes     Comment: social  . Drug Use: No  . Sexual Activity: Not on file   Other Topics Concern  . Not on file   Social History Narrative   Marital status: single; not dating      Children:  None      Lives: with roommates; renting.      Employment:  [employer]; started in April 2015; likes job.      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      Tobacco; none      Alcohol:  Socially      Drugs: none      Exercise: none   Family History  Problem Relation Age of Onset  . Cancer Mother     endometrial cancer       Objective:    BP 124/88 mmHg  Pulse 86  Temp(Src) 98.3 F (36.8 C)  Resp 16  Ht  (1.778 m)  Wt 222 lb (100.699 kg)  BMI 31.85 kg/m2 Physical Exam  Constitutional: He is oriented to person, place, and time. He appears well-developed and well-nourished. No distress.  HENT:  Head: Normocephalic and atraumatic.  Right Ear: External ear normal.  Left Ear: External ear normal.  Nose: Nose  normal.  Mouth/Throat: Oropharynx is clear and moist.  Eyes: Conjunctivae and EOM are normal. Pupils are equal, round, and reactive to light.  Neck: Normal range of motion. Neck supple. Carotid bruit is not present. No thyromegaly present.  Cardiovascular: Normal rate, regular rhythm, normal heart sounds and intact distal pulses.  Exam reveals no gallop and no friction rub.   No murmur heard. Pulmonary/Chest: Effort normal and breath sounds normal. He has no wheezes. He has no rales.  Abdominal: Soft. Bowel sounds are normal. He exhibits no distension and no mass. There is no tenderness. There is no rebound and no guarding.  Lymphadenopathy:    He has no cervical adenopathy.  Neurological: He is alert and oriented to person, place, and time. No cranial nerve deficit.  Skin: Skin is warm and dry. No rash noted. He is not diaphoretic.  Psychiatric: He has a normal mood and affect. His behavior is normal.  Nursing note and vitals reviewed.  Results for orders placed or performed in visit on 05/31/15  Comprehensive metabolic panel  Result Value Ref Range   Sodium 141 135 - 146 mmol/L   Potassium 4.4 3.5 - 5.3 mmol/L   Chloride 104 98 - 110 mmol/L   CO2 30 20 - 31 mmol/L   Glucose, Bld 98 65 - 99 mg/dL   BUN 10 7 - 25 mg/dL   Creat 4.09 8.11 - 9.14 mg/dL   Total Bilirubin 0.5 0.2 - 1.2 mg/dL   Alkaline Phosphatase 58 40 - 115 U/L   AST 13 10 - 40 U/L   ALT 15 9 - 46 U/L   Total Protein 6.9 6.1 - 8.1 g/dL   Albumin 4.3 3.6 - 5.1 g/dL   Calcium 9.4 8.6 - 78.2 mg/dL       Assessment & Plan:   1. Essential hypertension, benign   2. Attention-deficit hyperactivity disorder, predominantly hyperactive type   3. Encounter for therapeutic drug monitoring   4. Chronic throat clearing   5. Need for prophylactic vaccination and inoculation against influenza   6. Need for Tdap vaccination     1. HTN: borderline control; increase Lisinopril to  daily; obtain labs.   2.  ADD:  controlled; refills provided; using Ritalin sparingly. 3.  Chronic throat clearing: persistent despite Flonase, Zyrtec, Zantac trial. Recommend taking all three medications simultaneously; refer to ENT.  Onset prior to starting ACE-I. 4.  S/p TDAP and influenza vaccines.   Orders Placed This Encounter  Procedures  . Flu Vaccine QUAD 36+ mos IM  . Tdap vaccine greater than or equal to 7yo IM  . Comprehensive metabolic panel  . Ambulatory referral to ENT    Referral Priority:  Routine    Referral Type:  Consultation    Referral Reason:  Specialty Services Required    Requested  Specialty:  Otolaryngology    Number of Visits Requested:  1   Meds ordered this encounter  Medications  . lisinopril (PRINIVIL,ZESTRIL) 10 MG tablet    Sig: Take 1 tablet (10 mg total) by mouth daily.    Dispense:  90 tablet    Refill:  1  . methylphenidate (RITALIN) 10 MG tablet    Sig: Take 1 tablet (10 mg total) by mouth 2 (two) times daily.    Dispense:  60 tablet    Refill:  0    DO NOT FILL UNTIL 11-27-2014  . fluticasone (FLONASE) 50 MCG/ACT nasal spray    Sig: Place 2 sprays into both nostrils daily.    Dispense:  16 g    Refill:  11    Return in about 6 months (around 11/28/2015) for recheck hypertension.     Pariss Hommes Paulita FujitaMartin Margarete Horace, M.D. [facility]  [place] [address] [place], [place]  [id number] [phone] phone [phone] fax

## 2015-06-03 LAB — COMPREHENSIVE METABOLIC PANEL
ALT: 15 U/L (ref 9–46)
AST: 13 U/L (ref 10–40)
Albumin: 4.3 g/dL (ref 3.6–5.1)
Alkaline Phosphatase: 58 U/L (ref 40–115)
BUN: 10 mg/dL (ref 7–25)
CALCIUM: 9.4 mg/dL (ref 8.6–10.3)
CHLORIDE: 104 mmol/L (ref 98–110)
CO2: 30 mmol/L (ref 20–31)
Creat: 0.87 mg/dL (ref 0.60–1.35)
Glucose, Bld: 98 mg/dL (ref 65–99)
POTASSIUM: 4.4 mmol/L (ref 3.5–5.3)
Sodium: 141 mmol/L (ref 135–146)
TOTAL PROTEIN: 6.9 g/dL (ref 6.1–8.1)
Total Bilirubin: 0.5 mg/dL (ref 0.2–1.2)

## 2015-06-05 ENCOUNTER — Encounter: Payer: Self-pay | Admitting: Family Medicine

## 2015-11-26 ENCOUNTER — Ambulatory Visit (INDEPENDENT_AMBULATORY_CARE_PROVIDER_SITE_OTHER): Payer: Commercial Managed Care - HMO | Admitting: Family Medicine

## 2015-11-26 ENCOUNTER — Encounter: Payer: Self-pay | Admitting: Family Medicine

## 2015-11-26 VITALS — BP 122/66 | HR 92 | Temp 98.7°F | Resp 16 | Ht 69.5 in | Wt 210.4 lb

## 2015-11-26 DIAGNOSIS — F901 Attention-deficit hyperactivity disorder, predominantly hyperactive type: Secondary | ICD-10-CM

## 2015-11-26 DIAGNOSIS — J301 Allergic rhinitis due to pollen: Secondary | ICD-10-CM | POA: Diagnosis not present

## 2015-11-26 DIAGNOSIS — Z113 Encounter for screening for infections with a predominantly sexual mode of transmission: Secondary | ICD-10-CM

## 2015-11-26 DIAGNOSIS — I10 Essential (primary) hypertension: Secondary | ICD-10-CM | POA: Diagnosis not present

## 2015-11-26 LAB — COMPREHENSIVE METABOLIC PANEL
ALBUMIN: 4.7 g/dL (ref 3.6–5.1)
ALK PHOS: 59 U/L (ref 40–115)
ALT: 48 U/L — AB (ref 9–46)
AST: 29 U/L (ref 10–40)
BILIRUBIN TOTAL: 0.9 mg/dL (ref 0.2–1.2)
BUN: 13 mg/dL (ref 7–25)
CO2: 26 mmol/L (ref 20–31)
CREATININE: 0.83 mg/dL (ref 0.60–1.35)
Calcium: 9.6 mg/dL (ref 8.6–10.3)
Chloride: 105 mmol/L (ref 98–110)
Glucose, Bld: 80 mg/dL (ref 65–99)
Potassium: 3.5 mmol/L (ref 3.5–5.3)
SODIUM: 141 mmol/L (ref 135–146)
TOTAL PROTEIN: 7 g/dL (ref 6.1–8.1)

## 2015-11-26 LAB — CBC WITH DIFFERENTIAL/PLATELET
BASOS ABS: 41 {cells}/uL (ref 0–200)
BASOS PCT: 1 %
EOS PCT: 6 %
Eosinophils Absolute: 246 cells/uL (ref 15–500)
HCT: 46 % (ref 38.5–50.0)
HEMOGLOBIN: 15.8 g/dL (ref 13.2–17.1)
LYMPHS ABS: 1148 {cells}/uL (ref 850–3900)
Lymphocytes Relative: 28 %
MCH: 29.7 pg (ref 27.0–33.0)
MCHC: 34.3 g/dL (ref 32.0–36.0)
MCV: 86.5 fL (ref 80.0–100.0)
MONOS PCT: 8 %
MPV: 9.1 fL (ref 7.5–12.5)
Monocytes Absolute: 328 cells/uL (ref 200–950)
NEUTROS ABS: 2337 {cells}/uL (ref 1500–7800)
Neutrophils Relative %: 57 %
PLATELETS: 232 10*3/uL (ref 140–400)
RBC: 5.32 MIL/uL (ref 4.20–5.80)
RDW: 12.8 % (ref 11.0–15.0)
WBC: 4.1 10*3/uL (ref 3.8–10.8)

## 2015-11-26 LAB — POCT URINALYSIS DIP (MANUAL ENTRY)
Glucose, UA: NEGATIVE
LEUKOCYTES UA: NEGATIVE
NITRITE UA: NEGATIVE
PH UA: 5.5
RBC UA: NEGATIVE
Spec Grav, UA: >=1.03
Urobilinogen, UA: 1

## 2015-11-26 LAB — TSH: TSH: 0.65 m[IU]/L (ref 0.40–4.50)

## 2015-11-26 MED ORDER — METHYLPHENIDATE HCL 10 MG PO TABS: 10.0000 mg | ORAL_TABLET | Freq: Two times a day (BID) | ORAL | Status: DC

## 2015-11-26 NOTE — Patient Instructions (Signed)
     IF you received an x-ray today, you will receive an invoice from [facility]. Please contact [facility] at [phone] with questions or concerns regarding your invoice.   IF you received labwork today, you will receive an invoice from [facility]. Please contact [facility] at [phone] with questions or concerns regarding your invoice.   Our billing staff will not be able to assist you with questions regarding bills from these companies.  You will be contacted with the lab results as soon as they are available. The fastest way to get your results is to activate your My Chart account. Instructions are located on the last page of this paperwork. If you have not heard from us regarding the results in 2 weeks, please contact this office.      

## 2015-11-26 NOTE — Progress Notes (Signed)
Subjective:    Patient ID: Patrick Graham, male    DOB: 08/01/1985, 30 y.o.   MRN: [id number]  11/26/2015  Follow-up and Rib Injury   HPI This 30 y.o. male presents for six month follow-up:   1. L rib pain: leaning over to reach for something; heard a pop sound with onset of pain immediately; pain now with laughing, coughing, deep breathing.  Unusual mechanism.  No SOB; no coughing.    2. HTN: increased Lisinopril to  daily.  Not checking BP at home.  No dizziness.    3.  Obesity: has lost weight since last visit.  4.  ADD:  Not taking Ritalin much; taking when misses sleep and struggling the following day.    5.  Throat clearing: diagnosed with GERD.  Taking Prilosec  daily once daily.  Still will occur intermittently; has not been quite three months yet.  No follow-up recommended.    6. STD screening: has had one partner this year; requesting STD screening including HSV.  Denies penile discharge, dysuria, genital lesions.  Male partners only.  Review of Systems  Constitutional: Negative for fever, chills, diaphoresis, activity change, appetite change and fatigue.  Respiratory: Negative for cough and shortness of breath.   Cardiovascular: Negative for chest pain, palpitations and leg swelling.  Gastrointestinal: Negative for nausea, vomiting, abdominal pain and diarrhea.  Endocrine: Negative for cold intolerance, heat intolerance, polydipsia, polyphagia and polyuria.  Skin: Negative for color change, rash and wound.  Neurological: Negative for dizziness, tremors, seizures, syncope, facial asymmetry, speech difficulty, weakness, light-headedness, numbness and headaches.  Psychiatric/Behavioral: Negative for sleep disturbance and dysphoric mood. The patient is not nervous/anxious.     Past Medical History  Diagnosis Date  . Hypertension 07/28/2011    borderline  . ADHD (attention deficit hyperactivity disorder)     diagnosed in middle school.  Previous use of Concerta,  Adderall, Focalin  . Allergy    Past Surgical History  Procedure Laterality Date  . Wisdom tooth extraction     Allergies  Allergen Reactions  . Amoxicillin Nausea And Vomiting    Social History   Social History  . Marital Status: Single    Spouse Name: N/A  . Number of Children: N/A  . Years of Education: N/A   Occupational History  . Not on file.   Social History Main Topics  . Smoking status: Never Smoker   . Smokeless tobacco: Not on file  . Alcohol Use: Yes     Comment: social  . Drug Use: No  . Sexual Activity: Not on file   Other Topics Concern  . Not on file   Social History Narrative   Marital status: single; not dating      Children:  None      Lives: with roommates; renting.      Employment:  [employer]; started in April 2015; likes job.      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      Tobacco; none      Alcohol:  Socially      Drugs: none      Exercise: none   Family History  Problem Relation Age of Onset  . Cancer Mother     endometrial cancer       Objective:    BP 122/66 mmHg  Pulse 92  Temp(Src) 98.7 F (37.1 C) (Oral)  Resp 16  Ht 5' 9.5" (1.765 m)  Wt 210 lb 6.4 oz (95.437 kg)  BMI 30.64 kg/m2  SpO2 97% Physical Exam  Constitutional: He is oriented to person, place, and time. He appears well-developed and well-nourished. No distress.  HENT:  Head: Normocephalic and atraumatic.  Right Ear: External ear normal.  Left Ear: External ear normal.  Nose: Nose normal.  Mouth/Throat: Oropharynx is clear and moist.  Eyes: Conjunctivae and EOM are normal. Pupils are equal, round, and reactive to light.  Neck: Normal range of motion. Neck supple. Carotid bruit is not present. No thyromegaly present.  Cardiovascular: Normal rate, regular rhythm, normal heart sounds and intact distal pulses.  Exam reveals no gallop and no friction rub.   No murmur heard. Pulmonary/Chest: Effort normal and breath sounds  normal. He has no wheezes. He has no rales.  Abdominal: Soft. Bowel sounds are normal. He exhibits no distension and no mass. There is no tenderness. There is no rebound and no guarding.  Lymphadenopathy:    He has no cervical adenopathy.  Neurological: He is alert and oriented to person, place, and time. No cranial nerve deficit.  Skin: Skin is warm and dry. No rash noted. He is not diaphoretic.  Psychiatric: He has a normal mood and affect. His behavior is normal.  Nursing note and vitals reviewed.       Assessment & Plan:   1. Essential hypertension, benign   2. Attention-deficit hyperactivity disorder, predominantly hyperactive type   3. Seasonal allergic rhinitis due to pollen   4. Screening for STD (sexually transmitted disease)     Orders Placed This Encounter  Procedures  . GC/Chlamydia Probe Amp  . Comprehensive metabolic panel  . CBC with Differential/Platelet  . TSH  . HIV antibody  . RPR  . HSV(herpes simplex vrs) 1+2 ab-IgG  . POCT urinalysis dipstick  . EKG 12-Lead   Meds ordered this encounter  Medications  . omeprazole (PRILOSEC) 40 MG capsule    Sig: Take 40 mg by mouth daily.  . Cetirizine HCl (ZYRTEC PO)    Sig: Take by mouth daily.  Marland Kitchen. DISCONTD: methylphenidate (RITALIN) 10 MG tablet    Sig: Take 1 tablet (10 mg total) by mouth 2 (two) times daily.    Dispense:  60 tablet    Refill:  0  . methylphenidate (RITALIN) 10 MG tablet    Sig: Take 1 tablet (10 mg total) by mouth 2 (two) times daily.    Dispense:  60 tablet    Refill:  0    DO NOT FILL UNTIL 12-27-2015.    Return in about 6 months (around 05/28/2016) for complete physical examiniation.    Nallely Yost Paulita FujitaMartin Jazmine Longshore, M.D. [facility]  [place] [address] [place], [place]  [address] [phone] phone [phone] fax

## 2015-11-27 LAB — HIV ANTIBODY (ROUTINE TESTING W REFLEX): HIV 1&2 Ab, 4th Generation: NONREACTIVE

## 2015-11-27 LAB — GC/CHLAMYDIA PROBE AMP
CT PROBE, AMP APTIMA: NOT DETECTED
GC Probe RNA: NOT DETECTED

## 2015-11-27 LAB — RPR

## 2015-11-28 LAB — HSV(HERPES SIMPLEX VRS) I + II AB-IGG
HSV 1 Glycoprotein G Ab, IgG: <0.9 {index}
HSV 2 Glycoprotein G Ab, IgG: <0.9 {index}

## 2015-11-29 ENCOUNTER — Ambulatory Visit: Payer: Self-pay | Admitting: Family Medicine

## 2015-12-02 IMAGING — CR DG CHEST 2V
2 series · 2 of 2 positions shown · non-contrast
Comparison: None available for comparison at time of study
interpretation.

CLINICAL DATA: Chest pain for 24 hr.

EXAM:
CHEST  2 VIEW

[w chest pa]
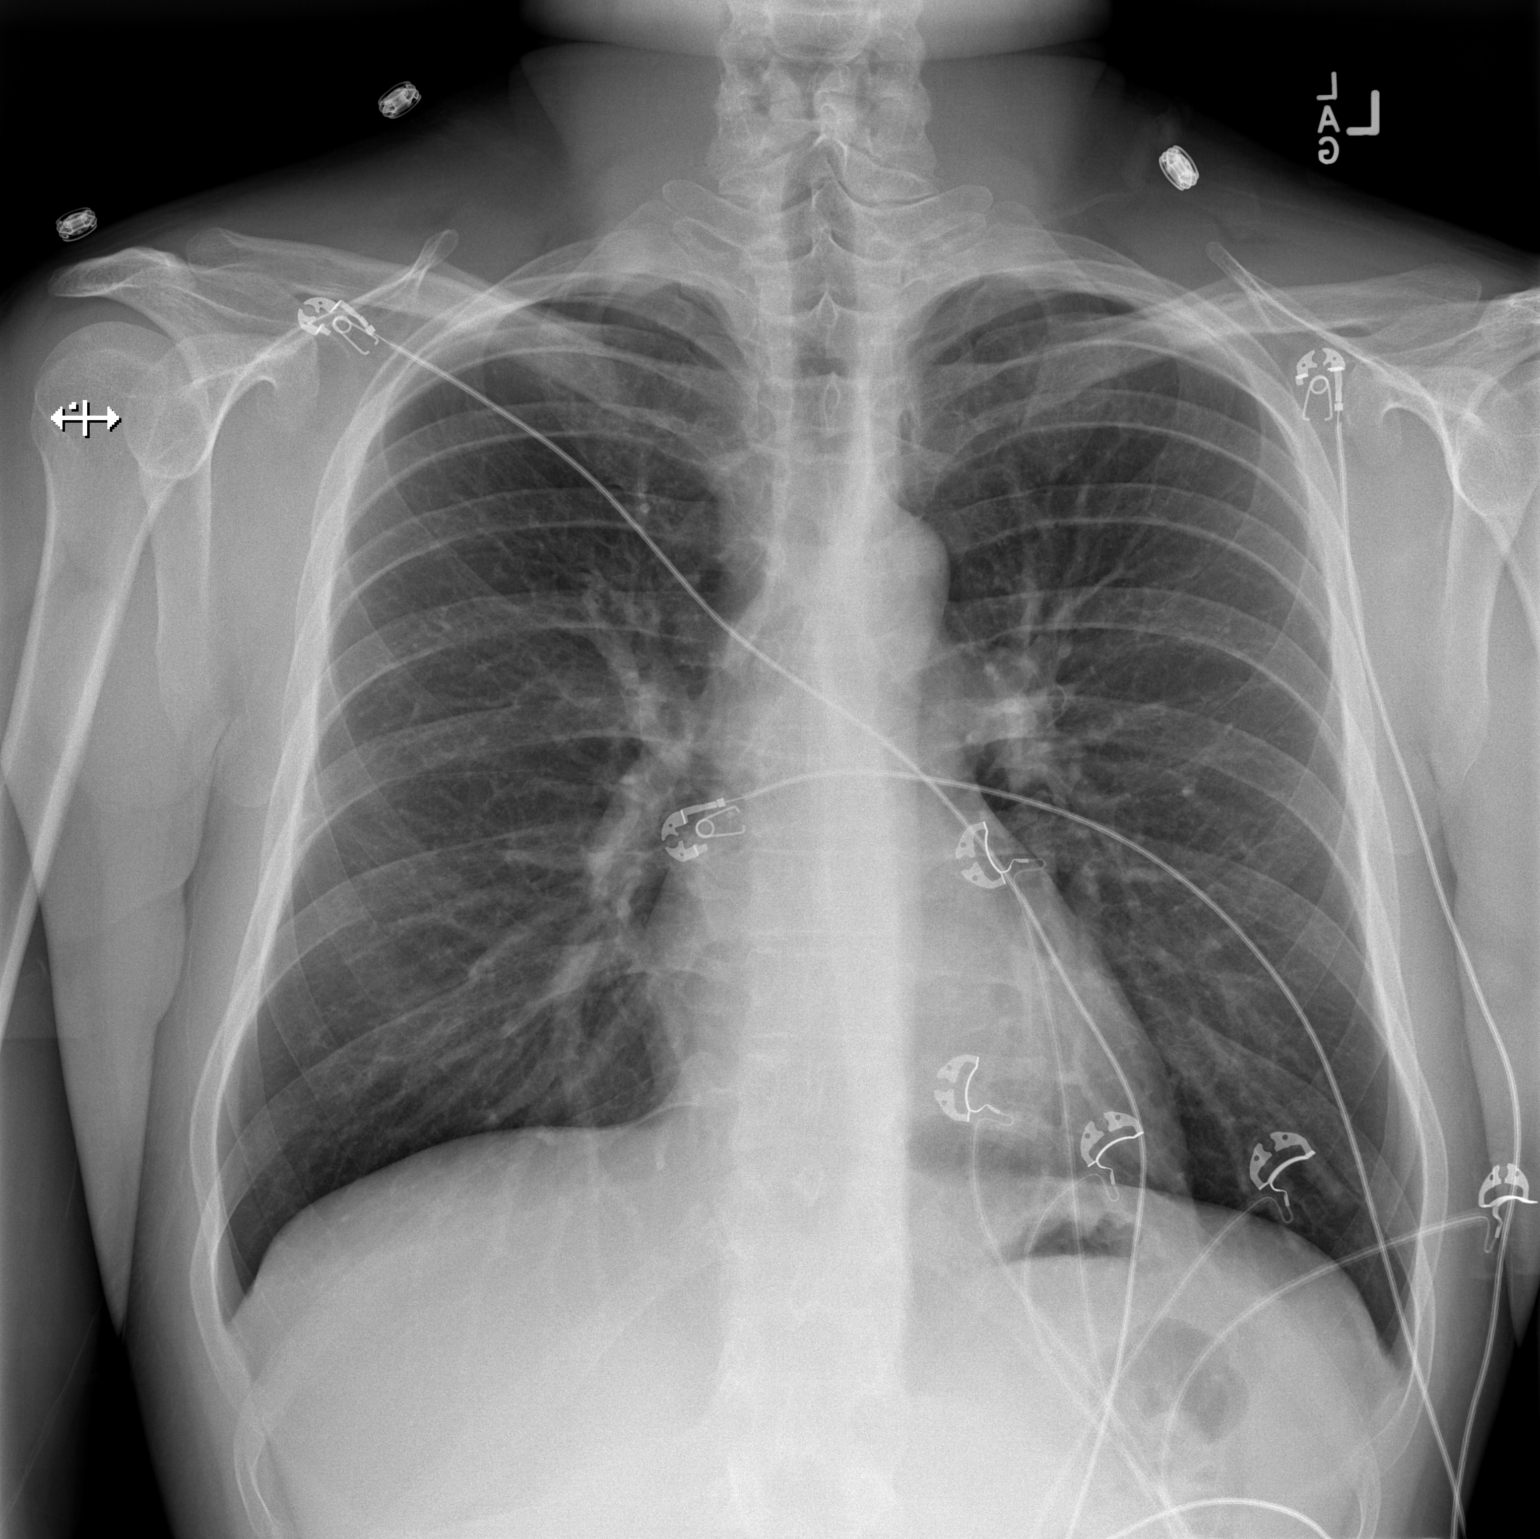

[w chest lat]
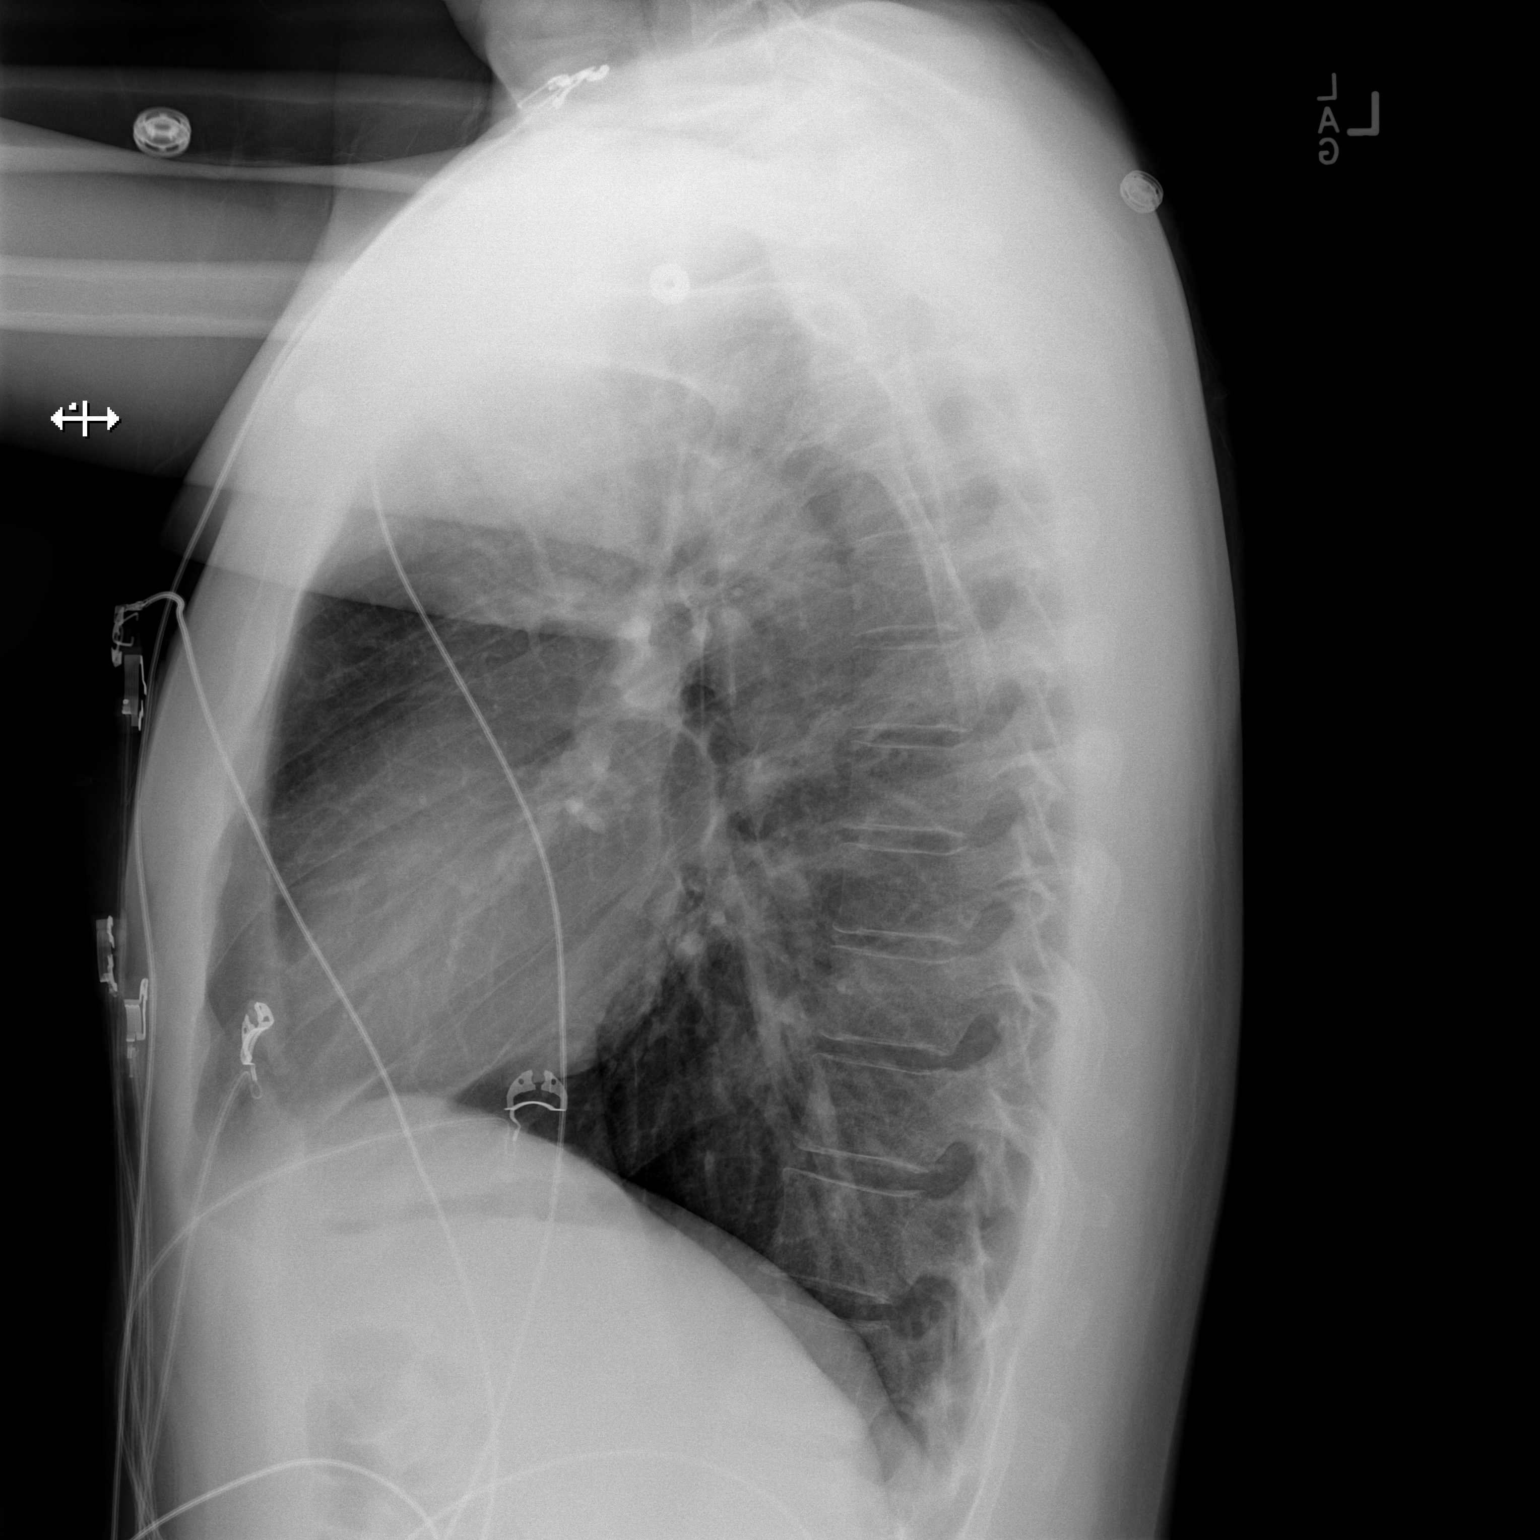

[2 of 2 positions shown; findings below may reference images not displayed]

FINDINGS: Cardiomediastinal silhouette is unremarkable. The lungs are clear
without pleural effusions or focal consolidations. Trachea projects
midline and there is no pneumothorax. Soft tissue planes and
included osseous structures are non-suspicious. Multiple EKG lines
overlie the patient and may obscure subtle underlying pathology.
IMPRESSION: No active cardiopulmonary disease.

  By: Shamir Jett

## 2015-12-08 ENCOUNTER — Encounter: Payer: Self-pay | Admitting: Family Medicine

## 2015-12-14 ENCOUNTER — Other Ambulatory Visit: Payer: Self-pay | Admitting: Family Medicine

## 2015-12-27 ENCOUNTER — Ambulatory Visit (INDEPENDENT_AMBULATORY_CARE_PROVIDER_SITE_OTHER): Payer: Commercial Managed Care - HMO | Admitting: Physician Assistant

## 2015-12-27 VITALS — BP 138/80 | HR 99 | Temp 98.2°F | Resp 18 | Ht 69.5 in | Wt 205.2 lb

## 2015-12-27 DIAGNOSIS — L0889 Other specified local infections of the skin and subcutaneous tissue: Secondary | ICD-10-CM

## 2015-12-27 DIAGNOSIS — L089 Local infection of the skin and subcutaneous tissue, unspecified: Secondary | ICD-10-CM

## 2015-12-27 NOTE — Progress Notes (Signed)
   12/27/2015 3:23 PM   DOB: 10/15/1985 / MRN: [id number]  SUBJECTIVE:  Patrick Graham is a 30 y.o. male presenting for a left lateral ankle rash that he noticed yesterday.  Complains of mild itching and a bright red rash.  Thinks that something may have bitten him. He denies fever, chills, nausea at this time.  Feels overall well.  Is worried he may have lyme disease or an infection.   He is allergic to amoxicillin.   He  has a past medical history of Hypertension (07/28/2011); ADHD (attention deficit hyperactivity disorder); and Allergy.    He  reports that he has never smoked. He does not have any smokeless tobacco history on file. He reports that he drinks alcohol. He reports that he does not use illicit drugs. He  has no sexual activity history on file. The patient  has past surgical history that includes Wisdom tooth extraction.  His family history includes Cancer in his mother.  Review of Systems  Gastrointestinal: Negative for nausea.  Skin: Positive for itching and rash.  Neurological: Negative for dizziness.    Problem list and medications reviewed and updated by myself where necessary, and exist elsewhere in the encounter.   OBJECTIVE:  BP 138/80 mmHg  Pulse 99  Temp(Src) 98.2 F (36.8 C) (Oral)  Resp 18  Ht 5' 9.5" (1.765 m)  Wt 205 lb 3.2 oz (93.078 kg)  BMI 29.88 kg/m2  SpO2 98%  Physical Exam  Constitutional: He is oriented to person, place, and time.  Cardiovascular: Normal rate and regular rhythm.   Pulmonary/Chest: Effort normal and breath sounds normal.  Musculoskeletal:       Feet:  Neurological: He is alert and oriented to person, place, and time.  Vitals reviewed.   No results found for this or any previous visit (from the past 72 hour(s)).  No results found.  ASSESSMENT AND PLAN  Patrick Graham was seen today for rash.  Diagnoses and all orders for this visit:  Pustular lesion: this may be an early cellulitis.  I have placed mupirocin on the wound and  a bandage.  Culture is out.  Given his itching it is hard to be sure this is infection or immune mediated.  I have instructed to call the clinic tomorrow if he is worse and I will start Keflex.  -     WOUND CULTURE ([facility] ONLY)   The patient was advised to call or return to clinic if he does not see an improvement in symptoms or to seek the care of the closest emergency department if he worsens with the above plan.   Deliah BostonMichael Geofrey Silliman, MHS, PA-C [facility] 12/27/2015 3:23 PM

## 2015-12-27 NOTE — Patient Instructions (Signed)
     IF you received an x-ray today, you will receive an invoice from [facility]. Please contact  Radiology at [phone] with questions or concerns regarding your invoice.   IF you received labwork today, you will receive an invoice from [facility]. Please contact [facility] at [phone] with questions or concerns regarding your invoice.   Our billing staff will not be able to assist you with questions regarding bills from these companies.  You will be contacted with the lab results as soon as they are available. The fastest way to get your results is to activate your My Chart account. Instructions are located on the last page of this paperwork. If you have not heard from us regarding the results in 2 weeks, please contact this office.      

## 2015-12-30 LAB — WOUND CULTURE
GRAM STAIN: NONE SEEN
GRAM STAIN: NONE SEEN
ORGANISM ID, BACTERIA: NO GROWTH

## 2016-05-20 ENCOUNTER — Other Ambulatory Visit: Payer: Self-pay | Admitting: Family Medicine

## 2016-06-17 ENCOUNTER — Other Ambulatory Visit: Payer: Self-pay | Admitting: Family Medicine

## 2016-06-20 NOTE — Telephone Encounter (Signed)
11/2015 last ov 

## 2016-07-07 ENCOUNTER — Encounter: Payer: Commercial Managed Care - HMO | Admitting: Family Medicine

## 2016-07-22 ENCOUNTER — Other Ambulatory Visit: Payer: Self-pay | Admitting: Family Medicine

## 2016-08-10 IMAGING — CR DG RIBS W/ CHEST 3+V*R*
3 series · 3 of 3 positions shown · non-contrast
Comparison: 10/15/2013

CLINICAL DATA: Right rib pain.  Trauma 1 week ago.

EXAM:
RIGHT RIBS AND CHEST - 3+ VIEW

[AP (1 of 2)]
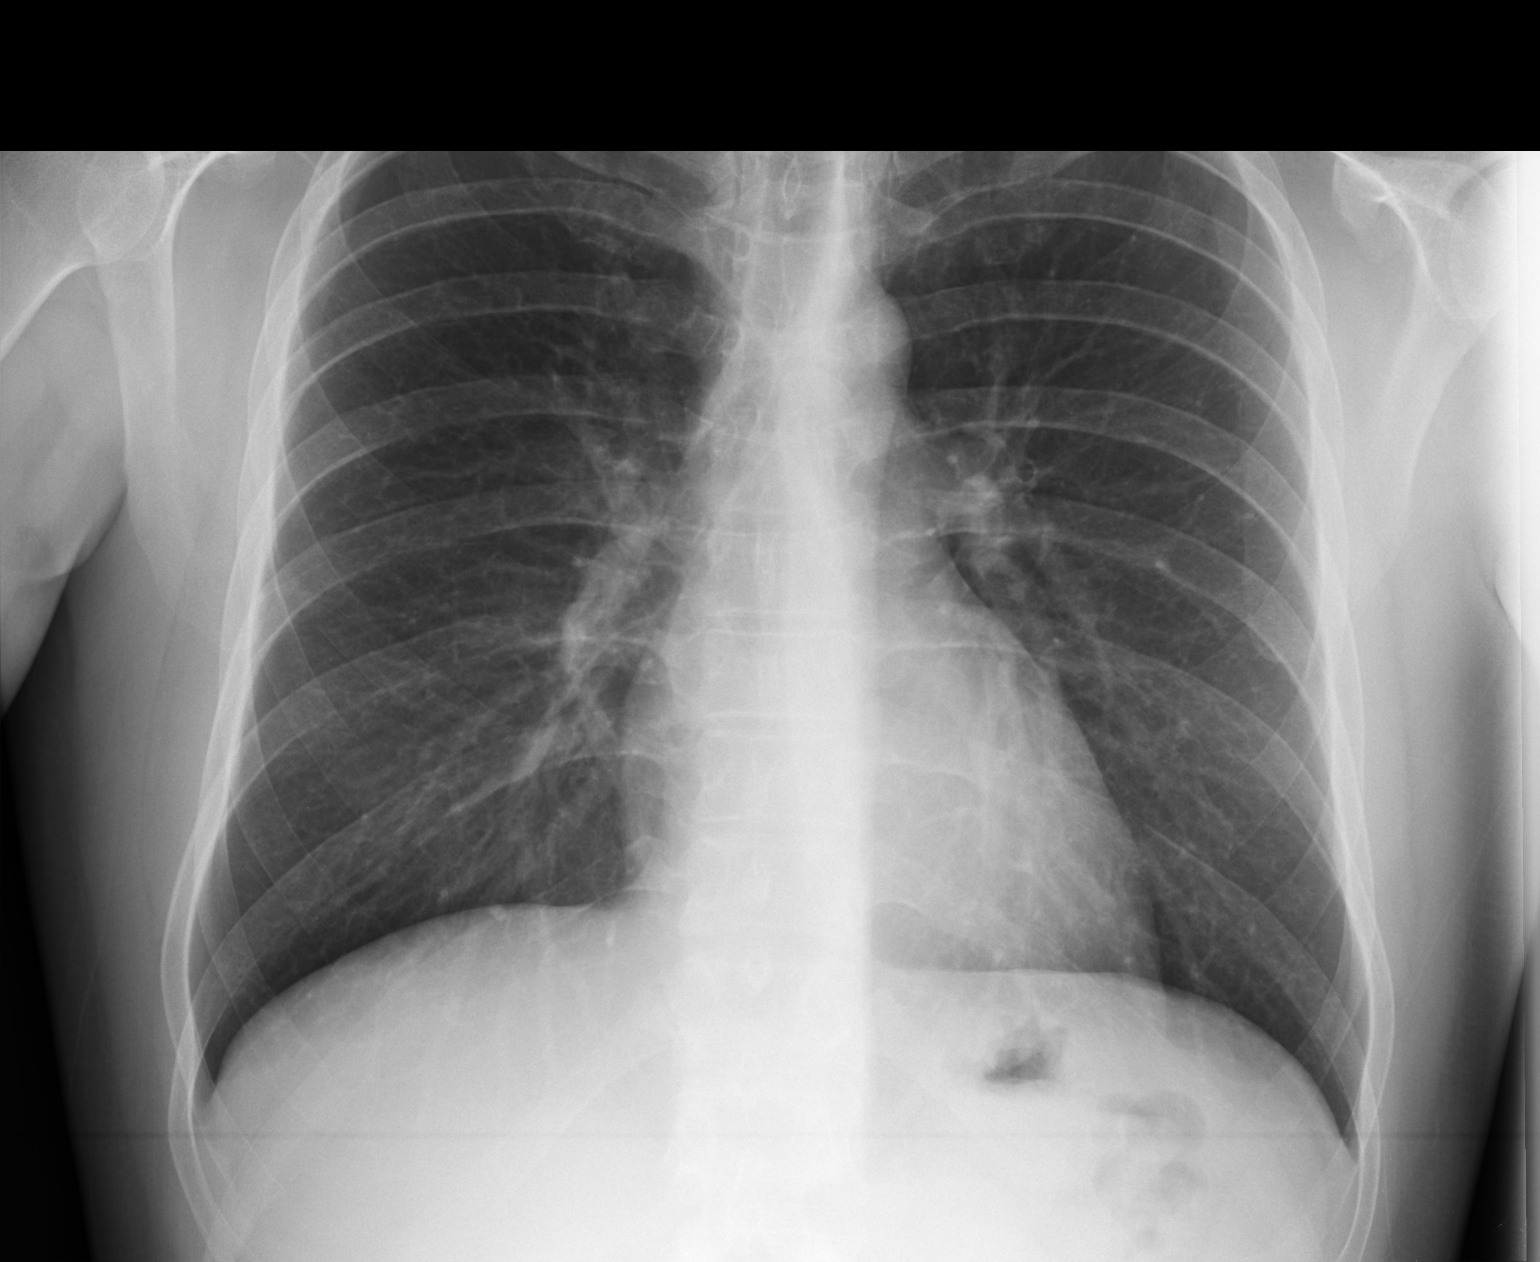

[AP (2 of 2)]
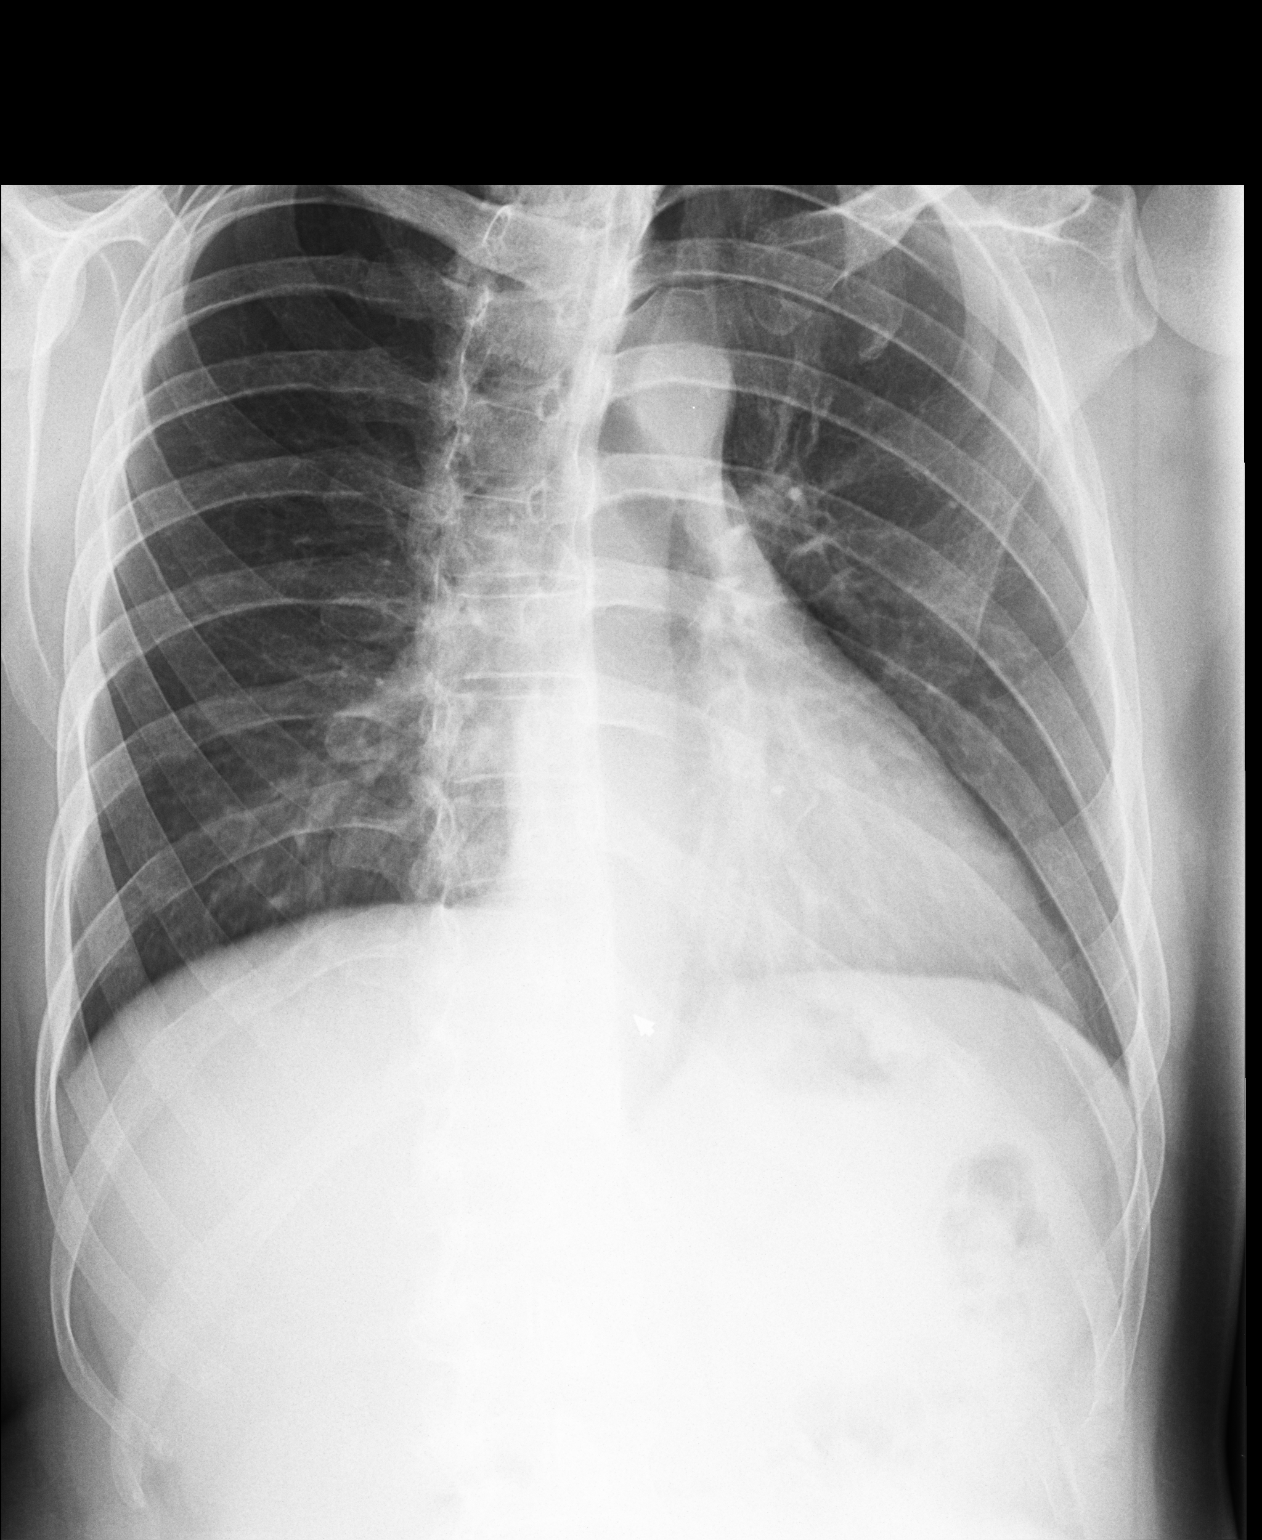

[rpo]
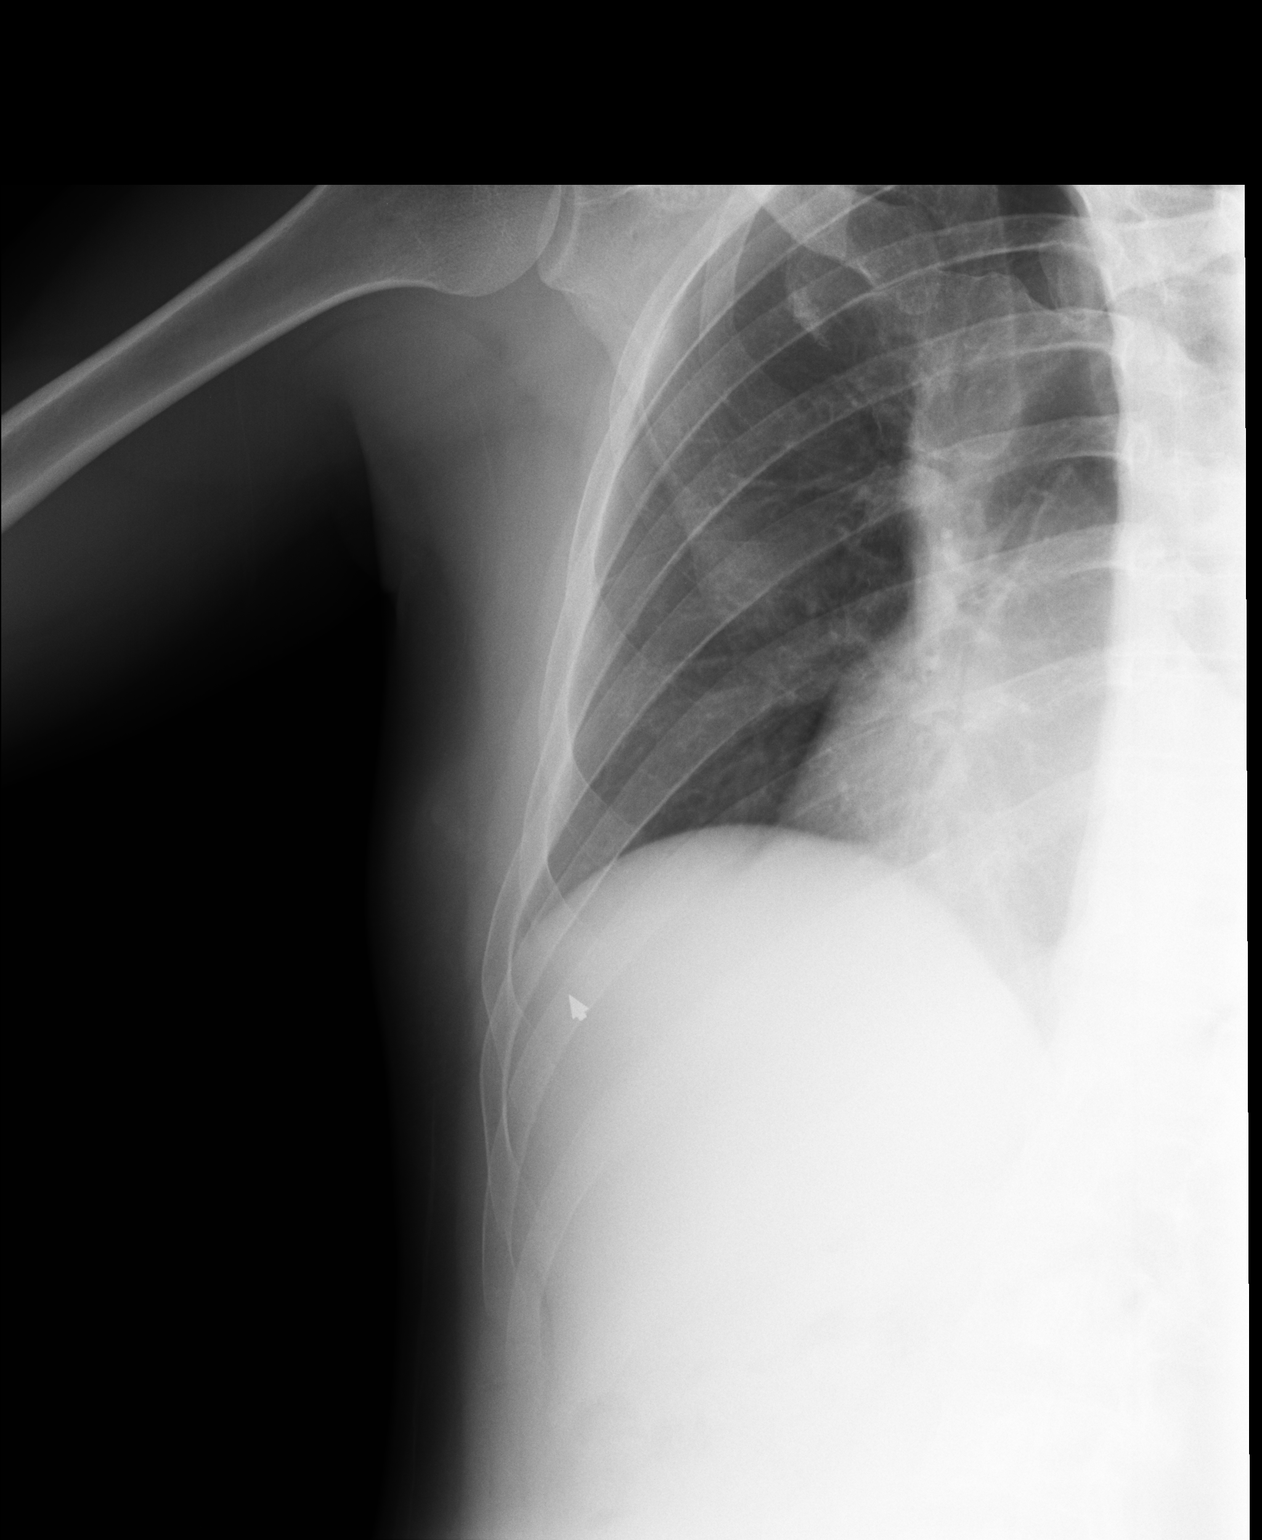

[3 of 3 positions shown; findings below may reference images not displayed]

FINDINGS: Lungs are clear. Heart size is normal. The trachea is midline.
Negative for a displaced right rib fracture.
IMPRESSION: No acute chest abnormality.

No evidence for a right rib fracture.

## 2016-09-09 ENCOUNTER — Encounter: Payer: Self-pay | Admitting: Family Medicine

## 2016-09-09 ENCOUNTER — Ambulatory Visit (INDEPENDENT_AMBULATORY_CARE_PROVIDER_SITE_OTHER): Payer: Commercial Managed Care - HMO | Admitting: Family Medicine

## 2016-09-09 VITALS — BP 121/76 | HR 76 | Temp 98.8°F | Resp 16 | Ht 69.5 in | Wt 170.0 lb

## 2016-09-09 DIAGNOSIS — I10 Essential (primary) hypertension: Secondary | ICD-10-CM

## 2016-09-09 DIAGNOSIS — J3089 Other allergic rhinitis: Secondary | ICD-10-CM

## 2016-09-09 DIAGNOSIS — F901 Attention-deficit hyperactivity disorder, predominantly hyperactive type: Secondary | ICD-10-CM | POA: Diagnosis not present

## 2016-09-09 DIAGNOSIS — Z113 Encounter for screening for infections with a predominantly sexual mode of transmission: Secondary | ICD-10-CM | POA: Diagnosis not present

## 2016-09-09 DIAGNOSIS — R7989 Other specified abnormal findings of blood chemistry: Secondary | ICD-10-CM | POA: Diagnosis not present

## 2016-09-09 DIAGNOSIS — R945 Abnormal results of liver function studies: Secondary | ICD-10-CM

## 2016-09-09 DIAGNOSIS — Z23 Encounter for immunization: Secondary | ICD-10-CM | POA: Diagnosis not present

## 2016-09-09 NOTE — Patient Instructions (Signed)
     IF you received an x-ray today, you will receive an invoice from [facility]. Please contact [facility] at [phone] with questions or concerns regarding your invoice.   IF you received labwork today, you will receive an invoice from [facility]. Please contact [facility] at [phone] with questions or concerns regarding your invoice.   Our billing staff will not be able to assist you with questions regarding bills from these companies.  You will be contacted with the lab results as soon as they are available. The fastest way to get your results is to activate your My Chart account. Instructions are located on the last page of this paperwork. If you have not heard from us regarding the results in 2 weeks, please contact this office.     

## 2016-09-09 NOTE — Progress Notes (Signed)
Subjective:    Patient ID: Patrick Graham, male    DOB: Apr 06, 1986, 31 y.o.   MRN: [id number]  09/09/2016  Follow-up (pt unsure on what he is here for) and Immunizations (flu)   HPI This 31 y.o. male presents for nine month follow-up evaluation of hypertension, ADD, and flu vaccine.   HTN:  Has been using [email or web address] to help with weight loss.  Just switched to maintenance.  Has lost 35 pounds since last visit.  Not exercising.   Had lost weight at previous job due to long shifts and standing; had started gaining with 911 due to sedentary lifestyle.  Had increased to 227.  Measured for a week; eating 3000-5000 calories.    ADD: rarely taking Ritalin; using if has not slept well.  Performance is good at work.  Allergic Rhinitis: Patient reports good compliance with medication, good tolerance to medication, and good symptom control.    Rare alcohol intake; rare tylenol intake; takes a lot of ibuprofen for headaches.  Rare headaches.  History of snoring.   STD screening: a new partner that did not work out.  Male.  Immunization History  Administered Date(s) Administered  . Influenza,inj,Quad PF,36+ Mos 09/26/2014, 05/31/2015, 09/09/2016  . Tdap 05/31/2015   BP Readings from Last 3 Encounters:  09/09/16 121/76  12/27/15 138/80  11/26/15 122/66   Wt Readings from Last 3 Encounters:  09/09/16 170 lb (77.1 kg)  12/27/15 205 lb 3.2 oz (93.1 kg)  11/26/15 210 lb 6.4 oz (95.4 kg)     Review of Systems  Constitutional: Negative for activity change, appetite change, chills, diaphoresis, fatigue and fever.  Eyes: Negative for visual disturbance.  Respiratory: Negative for cough and shortness of breath.   Cardiovascular: Negative for chest pain, palpitations and leg swelling.  Endocrine: Negative for cold intolerance, heat intolerance, polydipsia, polyphagia and polyuria.  Neurological: Negative for dizziness, tremors, seizures, syncope, facial asymmetry, speech difficulty,  weakness, light-headedness, numbness and headaches.    Past Medical History:  Diagnosis Date  . ADHD (attention deficit hyperactivity disorder)    diagnosed in middle school.  Previous use of Concerta, Adderall, Focalin  . Allergy   . Hypertension 07/28/2011   borderline   Past Surgical History:  Procedure Laterality Date  . WISDOM TOOTH EXTRACTION     Allergies  Allergen Reactions  . Amoxicillin Nausea And Vomiting    Social History   Social History  . Marital status: Single    Spouse name: N/A  . Number of children: N/A  . Years of education: N/A   Occupational History  . Not on file.   Social History Main Topics  . Smoking status: Never Smoker  . Smokeless tobacco: Not on file  . Alcohol use Yes     Comment: social  . Drug use: No  . Sexual activity: Yes   Other Topics Concern  . Not on file   Social History Narrative   Marital status: single; dating females      Children:  None      Lives: with roommates; renting.      Employment:  [employer]; started in April 2015; likes job. Third shift.      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      Tobacco; none      Alcohol:  Socially      Drugs: none      Exercise: none   Family History  Problem Relation Age of Onset  . Cancer Mother  endometrial cancer       Objective:    BP 121/76   Pulse 76   Temp 98.8 F (37.1 C) (Oral)   Resp 16   Ht 5' 9.5" (1.765 m)   Wt 170 lb (77.1 kg)   SpO2 97%   BMI 24.74 kg/m  Physical Exam  Constitutional: He is oriented to person, place, and time. He appears well-developed and well-nourished. No distress.  HENT:  Head: Normocephalic and atraumatic.  Right Ear: External ear normal.  Left Ear: External ear normal.  Nose: Nose normal.  Mouth/Throat: Oropharynx is clear and moist.  Eyes: Conjunctivae and EOM are normal. Pupils are equal, round, and reactive to light.  Neck: Normal range of motion. Neck supple. Carotid bruit is not  present. No thyromegaly present.  Cardiovascular: Normal rate, regular rhythm, normal heart sounds and intact distal pulses.  Exam reveals no gallop and no friction rub.   No murmur heard. Pulmonary/Chest: Effort normal and breath sounds normal. He has no wheezes. He has no rales.  Abdominal: Soft. Bowel sounds are normal. He exhibits no distension and no mass. There is no tenderness. There is no rebound and no guarding.  Lymphadenopathy:    He has no cervical adenopathy.  Neurological: He is alert and oriented to person, place, and time. No cranial nerve deficit. He exhibits normal muscle tone. Coordination normal.  Skin: Skin is warm and dry. No rash noted. He is not diaphoretic.  Psychiatric: He has a normal mood and affect. His behavior is normal. Judgment and thought content normal.  Nursing note and vitals reviewed.  Depression screen PHQ 2/9 09/09/2016 12/27/2015 11/26/2015 05/31/2015 09/26/2014  Decreased Interest 0 0 0 0 0  Down, Depressed, Hopeless 0 0 0 0 0  [facility] - 2 Score 0 0 0 0 0  Altered sleeping - - - - -  Tired, decreased energy - - - - -  Change in appetite - - - - -  Feeling bad or failure about yourself  - - - - -  Trouble concentrating - - - - -  Moving slowly or fidgety/restless - - - - -  Suicidal thoughts - - - - -  PHQ-9 Score - - - - -        Assessment & Plan:   1. Essential hypertension, benign   2. Attention-deficit hyperactivity disorder, predominantly hyperactive type   3. Chronic nonseasonal allergic rhinitis due to pollen   4. Elevated liver function tests   5. Screening examination for STD (sexually transmitted disease)   6. Flu vaccine need    -congratulations on intentional weight loss!  Monitor blood pressure closely at home. -obtain labs. -refill of Lisinopril provided. -rarely using Ritalin at this time.  -obtain STD screening per patient request; currently asymptomatic. -s/p flu vaccine.   Orders Placed This Encounter  Procedures  .  GC/Chlamydia Probe Amp  . Flu Vaccine QUAD 36+ mos IM  . RPR  . HIV antibody  . CBC with Differential/Platelet  . Comprehensive metabolic panel   Meds ordered this encounter  Medications  . lisinopril (PRINIVIL,ZESTRIL) 10 MG tablet    Sig: TAKE 1 TABLET(10 MG) BY MOUTH DAILY    Dispense:  90 tablet    Refill:  1    Return in about 6 months (around 03/09/2017) for complete physical examiniation.   Kristi Paulita FujitaMartin Smith, M.D. [facility]  [place] [facility] [address] [place], [place]  [address] [phone]  phone [phone] fax

## 2016-09-10 LAB — CBC WITH DIFFERENTIAL/PLATELET
BASOS ABS: 0.1 10*3/uL (ref 0.0–0.2)
Basos: 1 %
EOS (ABSOLUTE): 0.4 10*3/uL (ref 0.0–0.4)
EOS: 7 %
HEMATOCRIT: 45.1 % (ref 37.5–51.0)
HEMOGLOBIN: 15.7 g/dL (ref 13.0–17.7)
IMMATURE GRANULOCYTES: 0 %
Immature Grans (Abs): 0 10*3/uL (ref 0.0–0.1)
LYMPHS ABS: 2 10*3/uL (ref 0.7–3.1)
Lymphs: 34 %
MCH: 30.8 pg (ref 26.6–33.0)
MCHC: 34.8 g/dL (ref 31.5–35.7)
MCV: 89 fL (ref 79–97)
MONOCYTES: 9 %
Monocytes Absolute: 0.5 10*3/uL (ref 0.1–0.9)
NEUTROS PCT: 49 %
Neutrophils Absolute: 2.9 10*3/uL (ref 1.4–7.0)
Platelets: 240 10*3/uL (ref 150–379)
RBC: 5.09 x10E6/uL (ref 4.14–5.80)
RDW: 13 % (ref 12.3–15.4)
WBC: 5.8 10*3/uL (ref 3.4–10.8)

## 2016-09-10 LAB — COMPREHENSIVE METABOLIC PANEL
ALBUMIN: 4.7 g/dL (ref 3.5–5.5)
ALK PHOS: 62 IU/L (ref 39–117)
ALT: 14 IU/L (ref 0–44)
AST: 11 IU/L (ref 0–40)
Albumin/Globulin Ratio: 2.1 (ref 1.2–2.2)
BUN / CREAT RATIO: 19 (ref 9–20)
BUN: 14 mg/dL (ref 6–20)
Bilirubin Total: 0.4 mg/dL (ref 0.0–1.2)
CALCIUM: 9.4 mg/dL (ref 8.7–10.2)
CO2: 25 mmol/L (ref 18–29)
CREATININE: 0.73 mg/dL — AB (ref 0.76–1.27)
Chloride: 103 mmol/L (ref 96–106)
GFR calc Af Amer: 143 mL/min/{1.73_m2} (ref >59)
GFR, EST NON AFRICAN AMERICAN: 124 mL/min/{1.73_m2} (ref >59)
GLOBULIN, TOTAL: 2.2 g/dL (ref 1.5–4.5)
GLUCOSE: 101 mg/dL — AB (ref 65–99)
Potassium: 4.2 mmol/L (ref 3.5–5.2)
Sodium: 142 mmol/L (ref 134–144)
Total Protein: 6.9 g/dL (ref 6.0–8.5)

## 2016-09-10 LAB — HIV ANTIBODY (ROUTINE TESTING W REFLEX): HIV SCREEN 4TH GENERATION: NONREACTIVE

## 2016-09-10 LAB — RPR: RPR Ser Ql: NONREACTIVE

## 2016-09-11 LAB — GC/CHLAMYDIA PROBE AMP
CHLAMYDIA, DNA PROBE: NEGATIVE
Neisseria gonorrhoeae by PCR: NEGATIVE

## 2016-10-02 ENCOUNTER — Other Ambulatory Visit: Payer: Self-pay | Admitting: Family Medicine

## 2016-10-02 ENCOUNTER — Encounter: Payer: Self-pay | Admitting: Family Medicine

## 2016-10-02 MED ORDER — LISINOPRIL 10 MG PO TABS: ORAL_TABLET | ORAL | 1 refills | Status: DC

## 2017-01-09 ENCOUNTER — Other Ambulatory Visit: Payer: Self-pay | Admitting: Family Medicine

## 2017-03-09 ENCOUNTER — Encounter: Payer: Self-pay | Admitting: Family Medicine

## 2017-03-09 ENCOUNTER — Ambulatory Visit (INDEPENDENT_AMBULATORY_CARE_PROVIDER_SITE_OTHER): Payer: 59 | Admitting: Family Medicine

## 2017-03-09 VITALS — BP 145/93 | HR 68 | Temp 98.2°F | Resp 18 | Ht 70.47 in | Wt 172.0 lb

## 2017-03-09 DIAGNOSIS — Z131 Encounter for screening for diabetes mellitus: Secondary | ICD-10-CM

## 2017-03-09 DIAGNOSIS — F988 Other specified behavioral and emotional disorders with onset usually occurring in childhood and adolescence: Secondary | ICD-10-CM | POA: Diagnosis not present

## 2017-03-09 DIAGNOSIS — Z13220 Encounter for screening for lipoid disorders: Secondary | ICD-10-CM | POA: Diagnosis not present

## 2017-03-09 DIAGNOSIS — Z7251 High risk heterosexual behavior: Secondary | ICD-10-CM

## 2017-03-09 DIAGNOSIS — Z0000 Encounter for general adult medical examination without abnormal findings: Secondary | ICD-10-CM | POA: Diagnosis not present

## 2017-03-09 LAB — POCT URINALYSIS DIP (MANUAL ENTRY)
BILIRUBIN UA: NEGATIVE
Blood, UA: NEGATIVE
Glucose, UA: NEGATIVE mg/dL
Ketones, POC UA: NEGATIVE mg/dL
Leukocytes, UA: NEGATIVE
Nitrite, UA: NEGATIVE
PH UA: 8.5 — AB (ref 5.0–8.0)
Protein Ur, POC: NEGATIVE mg/dL
SPEC GRAV UA: 1.02 (ref 1.010–1.025)
UROBILINOGEN UA: 1 U/dL

## 2017-03-09 MED ORDER — METHYLPHENIDATE HCL 10 MG PO TABS: 10.0000 mg | ORAL_TABLET | Freq: Two times a day (BID) | ORAL | 0 refills | Status: DC

## 2017-03-09 NOTE — Progress Notes (Signed)
   Subjective:    Patient ID: Patrick Graham, male    DOB: 06/10/1986, 31 y.o.   MRN: [id number]  HPI    Review of Systems  Constitutional: Positive for activity change.  HENT: Positive for congestion.   Allergic/Immunologic: Positive for environmental allergies.       Objective:   Physical Exam        Assessment & Plan:

## 2017-03-09 NOTE — Patient Instructions (Addendum)
   IF you received an x-ray today, you will receive an invoice from [facility]. Please contact  Radiology at [phone] with questions or concerns regarding your invoice.   IF you received labwork today, you will receive an invoice from [facility]. Please contact [facility] at [phone] with questions or concerns regarding your invoice.   Our billing staff will not be able to assist you with questions regarding bills from these companies.  You will be contacted with the lab results as soon as they are available. The fastest way to get your results is to activate your My Chart account. Instructions are located on the last page of this paperwork. If you have not heard from us regarding the results in 2 weeks, please contact this office.     Preventive Care 18-39 Years, Male Preventive care refers to lifestyle choices and visits with your health care provider that can promote health and wellness. What does preventive care include?  A yearly physical exam. This is also called an annual well check.  Dental exams once or twice a year.  Routine eye exams. Ask your health care provider how often you should have your eyes checked.  Personal lifestyle choices, including: ? Daily care of your teeth and gums. ? Regular physical activity. ? Eating a healthy diet. ? Avoiding tobacco and drug use. ? Limiting alcohol use. ? Practicing safe sex. What happens during an annual well check? The services and screenings done by your health care provider during your annual well check will depend on your age, overall health, lifestyle risk factors, and family history of disease. Counseling Your health care provider may ask you questions about your:  Alcohol use.  Tobacco use.  Drug use.  Emotional well-being.  Home and relationship well-being.  Sexual activity.  Eating habits.  Work and work environment.  Screening You may have the following tests or  measurements:  Height, weight, and BMI.  Blood pressure.  Lipid and cholesterol levels. These may be checked every 5 years starting at age 20.  Diabetes screening. This is done by checking your blood sugar (glucose) after you have not eaten for a while (fasting).  Skin check.  Hepatitis C blood test.  Hepatitis B blood test.  Sexually transmitted disease (STD) testing.  Discuss your test results, treatment options, and if necessary, the need for more tests with your health care provider. Vaccines Your health care provider may recommend certain vaccines, such as:  Influenza vaccine. This is recommended every year.  Tetanus, diphtheria, and acellular pertussis (Tdap, Td) vaccine. You may need a Td booster every 10 years.  Varicella vaccine. You may need this if you have not been vaccinated.  HPV vaccine. If you are 26 or younger, you may need three doses over 6 months.  Measles, mumps, and rubella (MMR) vaccine. You may need at least one dose of MMR.You may also need a second dose.  Pneumococcal 13-valent conjugate (PCV13) vaccine. You may need this if you have certain conditions and have not been vaccinated.  Pneumococcal polysaccharide (PPSV23) vaccine. You may need one or two doses if you smoke cigarettes or if you have certain conditions.  Meningococcal vaccine. One dose is recommended if you are age 19-21 years and a first-year college student living in a residence hall, or if you have one of several medical conditions. You may also need additional booster doses.  Hepatitis A vaccine. You may need this if you have certain conditions or if you travel or work in places   places where you may be exposed to hepatitis A.  Hepatitis B vaccine. You may need this if you have certain conditions or if you travel or work in places where you may be exposed to hepatitis B.  Haemophilus influenzae type b (Hib) vaccine. You may need this if you have certain risk factors.  Talk to your health  care provider about which screenings and vaccines you need and how often you need them. This information is not intended to replace advice given to you by your health care provider. Make sure you discuss any questions you have with your health care provider. Document Released: 09/08/2001 Document Revised: 04/01/2016 Document Reviewed: 05/14/2015 Elsevier Interactive Patient Education  2017 [employer].

## 2017-03-09 NOTE — Progress Notes (Signed)
Subjective:    Patient ID: Patrick Graham, male    DOB: 03/06/1986, 31 y.o.   MRN: [id number]  03/09/2017  Annual Exam   HPI This 31 y.o. male presents for Complete Physical Examination.  Last physical:  none Eye exam:  05/2016; contacts Dental exam:  None; no time   Visual Acuity Screening   Right eye Left eye Both eyes  Without correction:     With correction: 20/20 20/20 20/20      BP Readings from Last 3 Encounters:  03/09/17 (!) 145/93  09/09/16 121/76  12/27/15 138/80   Wt Readings from Last 3 Encounters:  03/09/17 172 lb (78 kg)  09/09/16 170 lb (77.1 kg)  12/27/15 205 lb 3.2 oz (93.1 kg)   Immunization History  Administered Date(s) Administered  . Influenza,inj,Quad PF,6+ Mos 09/26/2014, 05/31/2015, 09/09/2016  . Tdap 05/31/2015    Review of Systems  Constitutional: Negative for activity change, appetite change, chills, diaphoresis, fatigue, fever and unexpected weight change.  HENT: Negative for congestion, dental problem, drooling, ear discharge, ear pain, facial swelling, hearing loss, mouth sores, nosebleeds, postnasal drip, rhinorrhea, sinus pressure, sneezing, sore throat, tinnitus, trouble swallowing and voice change.   Eyes: Negative for photophobia, pain, discharge, redness, itching and visual disturbance.  Respiratory: Negative for apnea, cough, choking, chest tightness, shortness of breath, wheezing and stridor.   Cardiovascular: Negative for chest pain, palpitations and leg swelling.  Gastrointestinal: Negative for abdominal pain, blood in stool, constipation, diarrhea, nausea and vomiting.  Endocrine: Negative for cold intolerance, heat intolerance, polydipsia, polyphagia and polyuria.  Genitourinary: Negative for decreased urine volume, difficulty urinating, discharge, dysuria, enuresis, flank pain, frequency, genital sores, hematuria, penile pain, penile swelling, scrotal swelling, testicular pain and urgency.  Musculoskeletal: Negative for  arthralgias, back pain, gait problem, joint swelling, myalgias, neck pain and neck stiffness.  Skin: Negative for color change, pallor, rash and wound.  Allergic/Immunologic: Negative for environmental allergies, food allergies and immunocompromised state.  Neurological: Negative for dizziness, tremors, seizures, syncope, facial asymmetry, speech difficulty, weakness, light-headedness, numbness and headaches.  Hematological: Negative for adenopathy. Does not bruise/bleed easily.  Psychiatric/Behavioral: Negative for agitation, behavioral problems, confusion, decreased concentration, dysphoric mood, hallucinations, self-injury, sleep disturbance and suicidal ideas. The patient is not nervous/anxious and is not hyperactive.        Bedtime 0700am; wakes up 200pm.  Melatonin supplements.  Nyquil or Tylenol PM.  Herbal tea with creamer.    Past Medical History:  Diagnosis Date  . ADHD (attention deficit hyperactivity disorder)    diagnosed in middle school.  Previous use of Concerta, Adderall, Focalin  . Allergy   . Hypertension 07/28/2011   borderline   Past Surgical History:  Procedure Laterality Date  . WISDOM TOOTH EXTRACTION     Allergies  Allergen Reactions  . Amoxicillin Nausea And Vomiting    Social History   Social History  . Marital status: Single    Spouse name: N/A  . Number of children: N/A  . Years of education: N/A   Occupational History  . Not on file.   Social History Main Topics  . Smoking status: Never Smoker  . Smokeless tobacco: Never Used  . Alcohol use Yes     Comment: social  . Drug use: No  . Sexual activity: Yes   Other Topics Concern  . Not on file   Social History Narrative   Marital status: single; dating females; casually in 2018      Children:  None  Lives: alone; cats      Employment:  [employer]; started in April 2015; likes job. Third shift.      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      Tobacco; none       Alcohol:  Socially; 1 drink per week      Drugs: none      Exercise: hiking a lot in 2018      Seatbelt: 100%; texting none      Sexual activity: total partners = 15; no STDs; condoms 50-75%   Family History  Problem Relation Age of Onset  . Cancer Mother        endometrial cancer  . Arthritis Mother        hip osteoarthritis       Objective:    BP (!) 145/93   Pulse 68   Temp 98.2 F (36.8 C) (Oral)   Resp 18   Ht 5' 10.47" (1.79 m)   Wt 172 lb (78 kg)   SpO2 98%   BMI 24.35 kg/m  Physical Exam  Constitutional: He is oriented to person, place, and time. He appears well-developed and well-nourished. No distress.  HENT:  Head: Normocephalic and atraumatic.  Right Ear: External ear normal.  Left Ear: External ear normal.  Nose: Nose normal.  Mouth/Throat: Oropharynx is clear and moist.  Eyes: Pupils are equal, round, and reactive to light. Conjunctivae and EOM are normal.  Neck: Normal range of motion. Neck supple. Carotid bruit is not present. No thyromegaly present.  Cardiovascular: Normal rate, regular rhythm, normal heart sounds and intact distal pulses.  Exam reveals no gallop and no friction rub.   No murmur heard. Pulmonary/Chest: Effort normal and breath sounds normal. He has no wheezes. He has no rales.  Abdominal: Soft. Bowel sounds are normal. He exhibits no distension and no mass. There is no tenderness. There is no rebound and no guarding. Hernia confirmed negative in the right inguinal area and confirmed negative in the left inguinal area.  Genitourinary: Testes normal and penis normal.  Musculoskeletal:       Right shoulder: Normal.       Left shoulder: Normal.       Cervical back: Normal.  Lymphadenopathy:    He has no cervical adenopathy.       Right: No inguinal adenopathy present.       Left: No inguinal adenopathy present.  Neurological: He is alert and oriented to person, place, and time. He has normal reflexes. No cranial nerve deficit. He  exhibits normal muscle tone. Coordination normal.  Skin: Skin is warm and dry. No rash noted. He is not diaphoretic.  Psychiatric: He has a normal mood and affect. His behavior is normal. Judgment and thought content normal.    No results found. Depression screen [facility] 2/9 09/09/2016 12/27/2015 11/26/2015 05/31/2015 09/26/2014  Decreased Interest 0 0 0 0 0  Down, Depressed, Hopeless 0 0 0 0 0  PHQ - 2 Score 0 0 0 0 0  Altered sleeping - - - - -  Tired, decreased energy - - - - -  Change in appetite - - - - -  Feeling bad or failure about yourself  - - - - -  Trouble concentrating - - - - -  Moving slowly or fidgety/restless - - - - -  Suicidal thoughts - - - - -  PHQ-9 Score - - - - -   Fall Risk  09/09/2016 12/27/2015 11/26/2015 05/31/2015  09/26/2014  Falls in the past year? No No No No No        Assessment & Plan:   1. Routine physical examination   2. Screening for diabetes mellitus   3. Screening, lipid   4. High risk sexual behavior   5. Attention deficit disorder (ADD) without hyperactivity    -anticipatory guidance provided --- exercise, weight loss, safe driving practices, safe sexual practices. -obtain age appropriate screening labs and labs for chronic disease management. -refill provided of Ritalin; pt is taking sparingly. -history of depression yet no issues lately. -blood pressure is borderline; monitor once weekly; RTC if BP> 140/90 average.    Orders Placed This Encounter  Procedures  . GC/Chlamydia Probe Amp  . CBC with Differential/Platelet  . Comprehensive metabolic panel    Order Specific Question:   Has the patient fasted?    Answer:   Yes  . Hemoglobin A1c  . Lipid panel    Order Specific Question:   Has the patient fasted?    Answer:   Yes  . TSH  . HIV antibody  . RPR  . POCT urinalysis dipstick   Meds ordered this encounter  Medications  . methylphenidate (RITALIN) 10 MG tablet    Sig: Take 1 tablet (10 mg total) by mouth 2 (two) times daily.     Dispense:  60 tablet    Refill:  0    DO NOT FILL UNTIL 12-27-2015.    Return in about 1 year (around 03/09/2018) for complete physical examiniation.   Alekzander Cardell Paulita Fujita, M.D. Primary [facility] at [facility] previously [facility] [address] [place], [place]  [address] [phone] phone [phone] fax

## 2017-03-10 LAB — COMPREHENSIVE METABOLIC PANEL
ALBUMIN: 4.6 g/dL (ref 3.5–5.5)
ALK PHOS: 64 IU/L (ref 39–117)
ALT: 16 IU/L (ref 0–44)
AST: 12 IU/L (ref 0–40)
Albumin/Globulin Ratio: 2 (ref 1.2–2.2)
BUN / CREAT RATIO: 15 (ref 9–20)
BUN: 11 mg/dL (ref 6–20)
Bilirubin Total: 0.4 mg/dL (ref 0.0–1.2)
CO2: 24 mmol/L (ref 20–29)
CREATININE: 0.72 mg/dL — AB (ref 0.76–1.27)
Calcium: 9.5 mg/dL (ref 8.7–10.2)
Chloride: 107 mmol/L — ABNORMAL HIGH (ref 96–106)
GFR calc Af Amer: 144 mL/min/{1.73_m2} (ref >59)
GFR calc non Af Amer: 124 mL/min/{1.73_m2} (ref >59)
GLUCOSE: 88 mg/dL (ref 65–99)
Globulin, Total: 2.3 g/dL (ref 1.5–4.5)
Potassium: 4 mmol/L (ref 3.5–5.2)
Sodium: 143 mmol/L (ref 134–144)
Total Protein: 6.9 g/dL (ref 6.0–8.5)

## 2017-03-10 LAB — TSH: TSH: 1.1 u[IU]/mL (ref 0.450–4.500)

## 2017-03-10 LAB — CBC WITH DIFFERENTIAL/PLATELET
BASOS ABS: 0.1 10*3/uL (ref 0.0–0.2)
Basos: 1 %
EOS (ABSOLUTE): 0.3 10*3/uL (ref 0.0–0.4)
EOS: 5 %
HEMATOCRIT: 43.4 % (ref 37.5–51.0)
Hemoglobin: 14.6 g/dL (ref 13.0–17.7)
IMMATURE GRANULOCYTES: 0 %
Immature Grans (Abs): 0 10*3/uL (ref 0.0–0.1)
LYMPHS ABS: 1.6 10*3/uL (ref 0.7–3.1)
Lymphs: 30 %
MCH: 30.2 pg (ref 26.6–33.0)
MCHC: 33.6 g/dL (ref 31.5–35.7)
MCV: 90 fL (ref 79–97)
Monocytes Absolute: 0.3 10*3/uL (ref 0.1–0.9)
Monocytes: 5 %
NEUTROS PCT: 59 %
Neutrophils Absolute: 3.1 10*3/uL (ref 1.4–7.0)
Platelets: 220 10*3/uL (ref 150–379)
RBC: 4.83 x10E6/uL (ref 4.14–5.80)
RDW: 12.7 % (ref 12.3–15.4)
WBC: 5.3 10*3/uL (ref 3.4–10.8)

## 2017-03-10 LAB — LIPID PANEL
Chol/HDL Ratio: 2.2 ratio (ref 0.0–5.0)
Cholesterol, Total: 124 mg/dL (ref 100–199)
HDL: 56 mg/dL (ref >39)
LDL Calculated: 48 mg/dL (ref 0–99)
Triglycerides: 99 mg/dL (ref 0–149)
VLDL CHOLESTEROL CAL: 20 mg/dL (ref 5–40)

## 2017-03-10 LAB — HIV ANTIBODY (ROUTINE TESTING W REFLEX): HIV Screen 4th Generation wRfx: NONREACTIVE

## 2017-03-10 LAB — HEMOGLOBIN A1C
Est. average glucose Bld gHb Est-mCnc: 97 mg/dL
HEMOGLOBIN A1C: 5 % (ref 4.8–5.6)

## 2017-03-10 LAB — GC/CHLAMYDIA PROBE AMP
CHLAMYDIA, DNA PROBE: NEGATIVE
Neisseria gonorrhoeae by PCR: NEGATIVE

## 2017-03-10 LAB — RPR: RPR: NONREACTIVE

## 2017-05-18 ENCOUNTER — Encounter: Payer: Self-pay | Admitting: Emergency Medicine

## 2017-05-18 ENCOUNTER — Ambulatory Visit (INDEPENDENT_AMBULATORY_CARE_PROVIDER_SITE_OTHER): Payer: 59 | Admitting: Emergency Medicine

## 2017-05-18 VITALS — BP 130/86 | HR 87 | Temp 98.7°F | Resp 16 | Ht 69.5 in | Wt 168.0 lb

## 2017-05-18 DIAGNOSIS — L089 Local infection of the skin and subcutaneous tissue, unspecified: Secondary | ICD-10-CM

## 2017-05-18 DIAGNOSIS — T148XXA Other injury of unspecified body region, initial encounter: Secondary | ICD-10-CM

## 2017-05-18 MED ORDER — CEPHALEXIN 500 MG PO CAPS: 500.0000 mg | ORAL_CAPSULE | Freq: Three times a day (TID) | ORAL | 0 refills | Status: AC

## 2017-05-18 NOTE — Progress Notes (Signed)
Patrick Graham 30 y.o.   Chief Complaint  Patient presents with  . Laceration    LEFT arm x 5 days    HISTORY OF PRESENT ILLNESS: This is a 31 y.o. male complaining of infected laceration to left arm x 5 days.  HPI   Prior to Admission medications   Medication Sig Start Date End Date Taking? Authorizing Provider  fluticasone (FLONASE) 50 MCG/ACT nasal spray SHAKE LIQUID AND USE 2 SPRAYS IN EACH NOSTRIL DAILY 01/10/17  Yes Patrick Chick, MD  ibuprofen (ADVIL,MOTRIN) 200 MG tablet Take 600 mg by mouth every 8 (eight) hours as needed for headache or moderate pain.   Yes [provider]  ibuprofen (ADVIL,MOTRIN) 600 MG tablet Take 1 tablet (600 mg total) by mouth 3 (three) times daily after meals. 10/16/13  Yes Patrick Racer, MD  methylphenidate (RITALIN) 10 MG tablet Take 1 tablet (10 mg total) by mouth 2 (two) times daily. 03/09/17  Yes Patrick Chick, MD  Cetirizine HCl (ZYRTEC PO) Take by mouth daily.    [provider]  lisinopril (PRINIVIL,ZESTRIL) 10 MG tablet TAKE 1 TABLET(10 MG) BY MOUTH DAILY Patient not taking: Reported on 03/09/2017 10/02/16   Patrick Chick, MD  omeprazole (PRILOSEC) 40 MG capsule Take 40 mg by mouth daily.    [provider]    Allergies  Allergen Reactions  . Amoxicillin Nausea And Vomiting    Patient Active Problem List   Diagnosis Date Noted  . Essential hypertension, benign 06/24/2014  . Attention-deficit hyperactivity disorder, predominantly hyperactive type 06/24/2014    Past Medical History:  Diagnosis Date  . ADHD (attention deficit hyperactivity disorder)    diagnosed in middle school.  Previous use of Concerta, Adderall, Focalin  . Allergy   . Hypertension 07/28/2011   borderline    Past Surgical History:  Procedure Laterality Date  . WISDOM TOOTH EXTRACTION      Social History   Social History  . Marital status: Single    Spouse name: N/A  . Number of children: N/A  . Years of education: N/A     Occupational History  . Not on file.   Social History Main Topics  . Smoking status: Never Smoker  . Smokeless tobacco: Never Used  . Alcohol use Yes     Comment: social  . Drug use: No  . Sexual activity: Yes   Other Topics Concern  . Not on file   Social History Narrative   Marital status: single; dating females; casually in 2018      Children:  None      Lives: alone; cats      Employment:  [employer]; started in April 2015; likes job. Third shift.      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      Tobacco; none      Alcohol:  Socially; 1 drink per week      Drugs: none      Exercise: hiking a lot in 2018      Seatbelt: 100%; texting none      Sexual activity: total partners = 15; no STDs; condoms 50-75%    Family History  Problem Relation Age of Onset  . Cancer Mother        endometrial cancer  . Arthritis Mother        hip osteoarthritis     Review of Systems  Constitutional: Negative.  Negative for chills and fever.  Respiratory: Negative for shortness of breath.  Cardiovascular: Negative for chest pain.  Gastrointestinal: Negative for abdominal pain, diarrhea, nausea and vomiting.  Musculoskeletal: Negative for myalgias.  Skin: Positive for itching and rash.  Neurological: Negative for dizziness and focal weakness.  Endo/Heme/Allergies: Negative.   All other systems reviewed and are negative.  Vitals:   05/18/17 1518  BP: 130/86  Pulse: 87  Resp: 16  Temp: 98.7 F (37.1 C)  SpO2: 97%    Physical Exam  Constitutional: He is oriented to person, place, and time. He appears well-developed and well-nourished.  HENT:  Head: Normocephalic and atraumatic.  Eyes: Pupils are equal, round, and reactive to light. Conjunctivae and EOM are normal.  Neck: Normal range of motion.  Cardiovascular: Normal rate.   Pulmonary/Chest: Effort normal.  Musculoskeletal: Normal range of motion.  Neurological: He is alert and oriented to  person, place, and time. No sensory deficit. He exhibits normal muscle tone.  Skin: Skin is warm and dry. Capillary refill takes less than 2 seconds.  Left forearm: +infected abrasion/laceration  Psychiatric: He has a normal mood and affect. His behavior is normal.  Vitals reviewed.    ASSESSMENT & PLAN: [name] was seen today for laceration.  Diagnoses and all orders for this visit:  Wound infection  Infected abrasion  Other orders -     cephALEXin (KEFLEX) 500 MG capsule; Take 1 capsule (500 mg total) by mouth 3 (three) times daily.    Patient Instructions       IF you received an x-ray today, you will receive an invoice from [facility]. Please contact [facility] at [phone] with questions or concerns regarding your invoice.   IF you received labwork today, you will receive an invoice from [facility]. Please contact [facility] at [phone] with questions or concerns regarding your invoice.   Our billing staff will not be able to assist you with questions regarding bills from these companies.  You will be contacted with the lab results as soon as they are available. The fastest way to get your results is to activate your My Chart account. Instructions are located on the last page of this paperwork. If you have not heard from [place] regarding the results in 2 weeks, please contact this office.     Cellulitis, Adult Cellulitis is a skin infection. The infected area is usually red and sore. This condition occurs most often in the arms and lower legs. It is very important to get treated for this condition. Follow these instructions at home:  Take over-the-counter and prescription medicines only as told by your doctor.  If you were prescribed an antibiotic medicine, take it as told by your doctor. Do not stop taking the antibiotic even if you start to feel better.  Drink enough fluid to keep your pee (urine) clear or pale yellow.  Do not touch or rub the  infected area.  Raise (elevate) the infected area above the level of your heart while you are sitting or lying down.  Place warm or cold wet cloths (warm or cold compresses) on the infected area. Do this as told by your doctor.  Keep all follow-up visits as told by your doctor. This is important. These visits let your doctor make sure your infection is not getting worse. Contact a doctor if:  You have a fever.  Your symptoms do not get better after 1-2 days of treatment.  Your bone or joint under the infected area starts to hurt after the skin has healed.  Your infection comes back. This can  happen in the same area or another area.  You have a swollen bump in the infected area.  You have new symptoms.  You feel ill and also have muscle aches and pains. Get help right away if:  Your symptoms get worse.  You feel very sleepy.  You throw up (vomit) or have watery poop (diarrhea) for a long time.  There are red streaks coming from the infected area.  Your red area gets larger.  Your red area turns darker. This information is not intended to replace advice given to you by your health care provider. Make sure you discuss any questions you have with your health care provider. Document Released: 12/30/2007 Document Revised: 12/19/2015 Document Reviewed: 05/22/2015 Elsevier Interactive Patient Education  2018 Elsevier Inc.     Edwina BarthMiguel Natsuko Kelsay, MD [facility]

## 2017-05-18 NOTE — Patient Instructions (Addendum)
     IF you received an x-ray today, you will receive an invoice from [facility]. Please contact [facility] at [phone] with questions or concerns regarding your invoice.   IF you received labwork today, you will receive an invoice from [facility]. Please contact [facility] at [phone] with questions or concerns regarding your invoice.   Our billing staff will not be able to assist you with questions regarding bills from these companies.  You will be contacted with the lab results as soon as they are available. The fastest way to get your results is to activate your My Chart account. Instructions are located on the last page of this paperwork. If you have not heard from us regarding the results in 2 weeks, please contact this office.      Cellulitis, Adult Cellulitis is a skin infection. The infected area is usually red and sore. This condition occurs most often in the arms and lower legs. It is very important to get treated for this condition. Follow these instructions at home:  Take over-the-counter and prescription medicines only as told by your doctor.  If you were prescribed an antibiotic medicine, take it as told by your doctor. Do not stop taking the antibiotic even if you start to feel better.  Drink enough fluid to keep your pee (urine) clear or pale yellow.  Do not touch or rub the infected area.  Raise (elevate) the infected area above the level of your heart while you are sitting or lying down.  Place warm or cold wet cloths (warm or cold compresses) on the infected area. Do this as told by your doctor.  Keep all follow-up visits as told by your doctor. This is important. These visits let your doctor make sure your infection is not getting worse. Contact a doctor if:  You have a fever.  Your symptoms do not get better after 1-2 days of treatment.  Your bone or joint under the infected area starts to hurt after the skin has healed.  Your  infection comes back. This can happen in the same area or another area.  You have a swollen bump in the infected area.  You have new symptoms.  You feel ill and also have muscle aches and pains. Get help right away if:  Your symptoms get worse.  You feel very sleepy.  You throw up (vomit) or have watery poop (diarrhea) for a long time.  There are red streaks coming from the infected area.  Your red area gets larger.  Your red area turns darker. This information is not intended to replace advice given to you by your health care provider. Make sure you discuss any questions you have with your health care provider. Document Released: 12/30/2007 Document Revised: 12/19/2015 Document Reviewed: 05/22/2015 Elsevier Interactive Patient Education  2018 Elsevier Inc.  

## 2017-09-04 ENCOUNTER — Other Ambulatory Visit: Payer: Self-pay | Admitting: Family Medicine

## 2017-10-05 ENCOUNTER — Other Ambulatory Visit: Payer: Self-pay | Admitting: Family Medicine

## 2017-10-05 NOTE — Telephone Encounter (Signed)
Flonase refill Last OV: Last OV that mentioned Flonase was 09/26/14. Pt was seen for an acute visit 05/18/17. Last Refill:09/06/17 Pharmacy:[facility] [address] [place] PCP: Nilda SimmerKristi Smith MD

## 2017-11-11 ENCOUNTER — Other Ambulatory Visit: Payer: Self-pay | Admitting: Family Medicine

## 2017-12-10 ENCOUNTER — Encounter: Payer: Self-pay | Admitting: Family Medicine

## 2017-12-15 ENCOUNTER — Encounter: Payer: Self-pay | Admitting: Family Medicine

## 2017-12-30 ENCOUNTER — Other Ambulatory Visit: Payer: Self-pay | Admitting: Family Medicine

## 2018-11-03 DIAGNOSIS — R6883 Chills (without fever): Secondary | ICD-10-CM | POA: Diagnosis not present

## 2018-11-21 ENCOUNTER — Ambulatory Visit: Payer: Self-pay

## 2018-11-21 NOTE — Telephone Encounter (Signed)
Pt. Calling to report over the weekend he developed a cough and fever - 100.6. Asking if there are any openings today. He does have a n appointment tomorrow. Spoke with Corrie Dandy in the practice and there is no availability.Instructed to call back if symptoms worsen or go to ED. Verbalizes understanding. Answer Assessment - Initial Assessment Questions 1. ONSET: "When did the cough begin?"      Started over the weekend 2. SEVERITY: "How bad is the cough today?"      Mild 3. RESPIRATORY DISTRESS: "Describe your breathing."      No distress 4. FEVER: "Do you have a fever?" If so, ask: "What is your temperature, how was it measured, and when did it start?"     100.6 5. SPUTUM: "Describe the color of your sputum" (clear, white, yellow, green)     Clear 6. HEMOPTYSIS: "Are you coughing up any blood?" If so ask: "How much?" (flecks, streaks, tablespoons, etc.)     No 7. CARDIAC HISTORY: "Do you have any history of heart disease?" (e.g., heart attack, congestive heart failure)      No 8. LUNG HISTORY: "Do you have any history of lung disease?"  (e.g., pulmonary embolus, asthma, emphysema)     No 9. PE RISK FACTORS: "Do you have a history of blood clots?" (or: recent major surgery, recent prolonged travel, bedridden)     No 10. OTHER SYMPTOMS: "Do you have any other symptoms?" (e.g., runny nose, wheezing, chest pain)       Chest tightness - yesterday 11. PREGNANCY: "Is there any chance you are pregnant?" "When was your last menstrual period?"       n/a 12. TRAVEL: "Have you traveled out of the country in the last month?" (e.g., travel history, exposures)       No  Protocols used: COUGH - ACUTE PRODUCTIVE-A-AH

## 2018-11-22 ENCOUNTER — Telehealth (INDEPENDENT_AMBULATORY_CARE_PROVIDER_SITE_OTHER): Payer: 59 | Admitting: Registered Nurse

## 2018-11-22 ENCOUNTER — Other Ambulatory Visit: Payer: Self-pay

## 2018-11-22 DIAGNOSIS — F32A Depression, unspecified: Secondary | ICD-10-CM | POA: Insufficient documentation

## 2018-11-22 DIAGNOSIS — R509 Fever, unspecified: Secondary | ICD-10-CM | POA: Diagnosis not present

## 2018-11-22 DIAGNOSIS — R05 Cough: Secondary | ICD-10-CM

## 2018-11-22 DIAGNOSIS — R51 Headache: Secondary | ICD-10-CM | POA: Diagnosis not present

## 2018-11-22 DIAGNOSIS — F329 Major depressive disorder, single episode, unspecified: Secondary | ICD-10-CM | POA: Insufficient documentation

## 2018-11-22 DIAGNOSIS — R0602 Shortness of breath: Secondary | ICD-10-CM | POA: Diagnosis not present

## 2018-11-22 DIAGNOSIS — Z20828 Contact with and (suspected) exposure to other viral communicable diseases: Secondary | ICD-10-CM

## 2018-11-22 DIAGNOSIS — Z7689 Persons encountering health services in other specified circumstances: Secondary | ICD-10-CM

## 2018-11-22 DIAGNOSIS — R6889 Other general symptoms and signs: Secondary | ICD-10-CM

## 2018-11-22 DIAGNOSIS — R197 Diarrhea, unspecified: Secondary | ICD-10-CM

## 2018-11-22 DIAGNOSIS — Z20822 Contact with and (suspected) exposure to covid-19: Secondary | ICD-10-CM

## 2018-11-22 NOTE — Progress Notes (Signed)
Pt states he feels he has been having COVID-19 symptoms x 4 days. He states he has been having Fever, Fatigue, swelling in the neck and shoulders, and some cough. He has not had any SHOB. Fever was 100.6 yesterday and today tempeture was 98.6.

## 2018-11-22 NOTE — Progress Notes (Signed)
Telemedicine Encounter- SOAP NOTE Established Patient  This telephone encounter was conducted with the patient's (or proxy's) verbal consent via audio telecommunications: yes  Patient was instructed to have this encounter in a suitably private space; and to only have persons present to whom they give permission to participate. In addition, patient identity was confirmed by use of name plus two identifiers (DOB and address).  I discussed the limitations, risks, security and privacy concerns of performing an evaluation and management service by telephone and the availability of in person appointments. I also discussed with the patient that there may be a patient responsible charge related to this service. The patient expressed understanding and agreed to proceed.  I spent a total of 25 minutes talking with the patient or their proxy.  CC: Establish Care, COVID-19 Sxs  Subjective   Patrick Graham is a 33 y.o. established patient. Telephone visit today for establish care, concern over Covid-19 sxs  Establish care: Pt previously on Dr Michaelle CopasSmith's panel. Had been seen for HTN, pt lost around 94 lbs, HTN resolved. GERD resolved as well.  COVID-19 sxs: Pt endorses headache, cough, home temp of 100.61F, chills, aches in neck and back, chest tightness, upset stomach and diarrhea, tenderness in area of cervical chain of lymph nodes in neck. Denies SOB, vomiting, change in sensation of taste or smell. Symptoms started 3-4 days prior. 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  Pt lives at home with girlfriend and her 34 year old daughter. He states his girlfriend has asthma and has been displaying similar symptoms. She has been tested for COVID-19 and is awaiting results. Pt reports taking acetaminophen for headache with favorable results. Pt has not been to work since onset of symptoms.     Patient Active Problem List   Diagnosis Date Noted  . Depression 11/22/2018  .  Essential hypertension, benign 06/24/2014  . Attention-deficit hyperactivity disorder, predominantly hyperactive type 06/24/2014    Past Medical History:  Diagnosis Date  . ADHD (attention deficit hyperactivity disorder)    diagnosed in middle school.  Previous use of Concerta, Adderall, Focalin  . Allergy   . Hypertension 07/28/2011   borderline    Current Outpatient Medications  Medication Sig Dispense Refill  . Cetirizine HCl (ZYRTEC PO) Take by mouth daily.    . famotidine (PEPCID) 10 MG tablet Take 10 mg by mouth 2 (two) times daily.    . fluticasone (FLONASE) 50 MCG/ACT nasal spray SHAKE LIQUID AND USE 2 SPRAYS IN EACH NOSTRIL DAILY 16 g 0  . ibuprofen (ADVIL,MOTRIN) 200 MG tablet Take 600 mg by mouth every 8 (eight) hours as needed for headache or moderate pain.    Marland Kitchen. ibuprofen (ADVIL,MOTRIN) 600 MG tablet Take 1 tablet (600 mg total) by mouth 3 (three) times daily after meals. 30 tablet 0  . lisinopril (PRINIVIL,ZESTRIL) 10 MG tablet TAKE 1 TABLET(10 MG) BY MOUTH DAILY (Patient not taking: Reported on 11/22/2018) 90 tablet 1  . omeprazole (PRILOSEC) 40 MG capsule Take 40 mg by mouth daily.     No current facility-administered medications for this visit.     Allergies  Allergen Reactions  . Amoxicillin Nausea And Vomiting    Social History   Socioeconomic History  . Marital status: Single    Spouse name: Not on file  . Number of children: Not on file  . Years of education: Not on file  . Highest education level: Not on file  Occupational History  . Not  on file  Social Needs  . Financial resource strain: Not on file  . Food insecurity:    Worry: Not on file    Inability: Not on file  . Transportation needs:    Medical: Not on file    Non-medical: Not on file  Tobacco Use  . Smoking status: Never Smoker  . Smokeless tobacco: Never Used  Substance and Sexual Activity  . Alcohol use: Yes    Comment: social  . Drug use: No  . Sexual activity: Yes  Lifestyle  .  Physical activity:    Days per week: Not on file    Minutes per session: Not on file  . Stress: Not on file  Relationships  . Social connections:    Talks on phone: Not on file    Gets together: Not on file    Attends religious service: Not on file    Active member of club or organization: Not on file    Attends meetings of clubs or organizations: Not on file    Relationship status: Not on file  . Intimate partner violence:    Fear of current or ex partner: Not on file    Emotionally abused: Not on file    Physically abused: Not on file    Forced sexual activity: Not on file  Other Topics Concern  . Not on file  Social History Narrative   Marital status: single; dating females; casually in 2018      Children:  None      Lives: alone; cats      Employment:  [employer]; started in April 2015; likes job. Third shift.      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      Tobacco; none      Alcohol:  Socially; 1 drink per week      Drugs: none      Exercise: hiking a lot in 2018      Seatbelt: 100%; texting none      Sexual activity: total partners = 15; no STDs; condoms 50-75%    Review of Systems  Constitutional: Positive for chills, fever and malaise/fatigue.  HENT: Positive for sore throat. Negative for congestion and sinus pain.   Respiratory: Positive for cough. Negative for sputum production, shortness of breath and wheezing.   Cardiovascular: Negative for chest pain and orthopnea.  Gastrointestinal: Positive for diarrhea. Negative for constipation, heartburn, nausea and vomiting.    Objective   Vitals as reported by the patient: There were no vitals filed for this visit. Pt self reported 100.30F temp at home.   Diagnoses and all orders for this visit:  Encounter to establish care  Suspected Covid-19 Virus Infection  PLAN:  Strongly suspect COVID-19 infection, however, patient is low risk for complication. At this time, supportive care  warranted with follow up in 3 days.  Pt encouraged to drink excess clear fluids, have well rounded meals, and rest for duration of symptoms.  Pt advised on reasons to go to ED including, but not limited to: SOB, crushing chest pain, "worst headache I've ever had" or new and changing headache, high intractable fever, dizziness/confusion.  Pt encouraged to monitor girlfriend, who he lives with, as she is asthmatic. She has been tested for COVID-19 and is awaiting results  Pt encouraged to contact clinic with any questions, comments, or concerns.    I discussed the assessment and treatment plan with the patient. The patient was provided an opportunity to ask questions  and all were answered. The patient agreed with the plan and demonstrated an understanding of the instructions.   The patient was advised to call back or seek an in-person evaluation if the symptoms worsen or if the condition fails to improve as anticipated.  I provided 25 minutes of non-face-to-face time during this encounter.  Janeece Agee, NP  Primary Care at [facility]

## 2018-11-25 ENCOUNTER — Telehealth: Payer: Self-pay | Admitting: Registered Nurse

## 2018-11-25 NOTE — Telephone Encounter (Signed)
Copied from CRM [id number]. Topic: General - Inquiry >> Nov 25, 2018  3:43 PM Lynne Logan D wrote: Reason for CRM: Pt stated he thought Gerlene Burdock was going to call him today to follow up from their initial appointment. He stated that his girlfriend tested negative and symptoms are subsiding and he is supposed to go back to work tomorrow and is looking for guidance on that. Please advise. [phone]

## 2018-11-28 ENCOUNTER — Encounter: Payer: Self-pay | Admitting: Registered Nurse

## 2018-11-28 NOTE — Progress Notes (Signed)
This letter is for Patrick Graham should he need one for excused absences from work d/t COVID-19 risk.  He may return to work at this time.   Janeece Agee, NP [facility] Primary Care at [facility] [address] [place], [place] [address] [phone] - 0000

## 2018-11-28 NOTE — Progress Notes (Signed)
I called Patrick Graham to follow up on COVID-19 sxs. He has reported that his sxs have improved and his partner, who he lives with and was experiencing similar symptoms, has tested negative for COVID-19 and also improved.   He did not answer the phone and I left a VM at this time. I have written a return to work letter and placed it in his chart. Should he call back and need documentation, this letter can be printed and presented to him or can be accessed through a MyChart account.   Janeece Agee, NP [facility] Primary Care at [facility] [address] [place], [place] [address] [phone] - 0000

## 2018-11-28 NOTE — Telephone Encounter (Signed)
Pt states he is returning call to Janeece Agee, NP.  Everyone seemed well in the house until this morning. Pt felt a dull aching pain in left flank area and was shivering in a warm room. Pt states temperature at 11:30am was 98 and the other ear was 98.2. Neck/shoulder pain has resolved. No cough, SOB, dizziness. Temp 5/4 2:33pm is 98.9.  Call back # [phone]

## 2019-02-15 ENCOUNTER — Telehealth (INDEPENDENT_AMBULATORY_CARE_PROVIDER_SITE_OTHER): Payer: 59 | Admitting: Registered Nurse

## 2019-02-15 DIAGNOSIS — I10 Essential (primary) hypertension: Secondary | ICD-10-CM | POA: Diagnosis not present

## 2019-02-15 DIAGNOSIS — Z20822 Contact with and (suspected) exposure to covid-19: Secondary | ICD-10-CM

## 2019-02-15 MED ORDER — LISINOPRIL 10 MG PO TABS: ORAL_TABLET | ORAL | 0 refills | Status: DC

## 2019-02-15 NOTE — Progress Notes (Signed)
Spoke with pt today and he informed me that he has been having COVID symptoms such as temp,lack of taste, and cough. He states when he went to have COVID testing they checked his B/P and it was elevated so he would like to possibly start back on the Lisinopril. He states he stopped taking a few months back. He is in self quarantine right now.

## 2019-02-15 NOTE — Progress Notes (Signed)
Telemedicine Encounter- SOAP NOTE Established Patient  This telephone encounter was conducted with the patient's (or proxy's) verbal consent via audio telecommunications: yes  Patient was instructed to have this encounter in a suitably private space; and to only have persons present to whom they give permission to participate. In addition, patient identity was confirmed by use of name plus two identifiers (DOB and address).  I discussed the limitations, risks, security and privacy concerns of performing an evaluation and management service by telephone and the availability of in person appointments. I also discussed with the patient that there may be a patient responsible charge related to this service. The patient expressed understanding and agreed to proceed.  I spent a total of 14 minutes talking with the patient or their proxy.  No chief complaint on file.   Subjective   Patrick Graham is a 33 y.o. established patient. Telephone visit today for COVID symptoms and HTN  HPI Pt states that over the preceding week, he has noted COVID symptoms of fever, change in taste, and cough. He went to [facility] this past weekend for a COVID test, but unfortunately, the results are not yet returned. He notes mild improvement in symptoms. We have requested that he has results sent to our office.  While at [facility] to receive COVID testing, he was told that he had elevated blood pressure - he has had this problem in the past, but after losing significant weight, no longer required medical management of this problem. At [facility], his BP was noted to be 160s/120s. He continues to be asymptomatic. He states his stress level is high and he has had white coat hypertension in the past.   Patient Active Problem List   Diagnosis Date Noted  . Depression 11/22/2018  . Essential hypertension, benign 06/24/2014  . Attention-deficit hyperactivity disorder, predominantly hyperactive type 06/24/2014    Past  Medical History:  Diagnosis Date  . ADHD (attention deficit hyperactivity disorder)    diagnosed in middle school.  Previous use of Concerta, Adderall, Focalin  . Allergy   . Hypertension 07/28/2011   borderline    Current Outpatient Medications  Medication Sig Dispense Refill  . Cetirizine HCl (ZYRTEC PO) Take by mouth daily.    . famotidine (PEPCID) 10 MG tablet Take 10 mg by mouth 2 (two) times daily.    . fluticasone (FLONASE) 50 MCG/ACT nasal spray SHAKE LIQUID AND USE 2 SPRAYS IN EACH NOSTRIL DAILY 16 g 0  . ibuprofen (ADVIL,MOTRIN) 200 MG tablet Take 600 mg by mouth every 8 (eight) hours as needed for headache or moderate pain.    Marland Kitchen. ibuprofen (ADVIL,MOTRIN) 600 MG tablet Take 1 tablet (600 mg total) by mouth 3 (three) times daily after meals. 30 tablet 0  . omeprazole (PRILOSEC) 40 MG capsule Take 40 mg by mouth daily.    Marland Kitchen. lisinopril (ZESTRIL) 10 MG tablet TAKE 1 TABLET(10 MG) BY MOUTH DAILY 90 tablet 0   No current facility-administered medications for this visit.     Allergies  Allergen Reactions  . Amoxicillin Nausea And Vomiting    Social History   Socioeconomic History  . Marital status: Single    Spouse name: Not on file  . Number of children: Not on file  . Years of education: Not on file  . Highest education level: Not on file  Occupational History  . Not on file  Social Needs  . Financial resource strain: Not on file  . Food insecurity  Worry: Not on file    Inability: Not on file  . Transportation needs    Medical: Not on file    Non-medical: Not on file  Tobacco Use  . Smoking status: Never Smoker  . Smokeless tobacco: Never Used  Substance and Sexual Activity  . Alcohol use: Yes    Comment: social  . Drug use: No  . Sexual activity: Yes  Lifestyle  . Physical activity    Days per week: Not on file    Minutes per session: Not on file  . Stress: Not on file  Relationships  . Social Musicianconnections    Talks on phone: Not on file    Gets  together: Not on file    Attends religious service: Not on file    Active member of club or organization: Not on file    Attends meetings of clubs or organizations: Not on file    Relationship status: Not on file  . Intimate partner violence    Fear of current or ex partner: Not on file    Emotionally abused: Not on file    Physically abused: Not on file    Forced sexual activity: Not on file  Other Topics Concern  . Not on file  Social History Narrative   Marital status: single; dating females; casually in 2018      Children:  None      Lives: alone; cats      Employment:  [employer]; started in April 2015; likes job. Third shift.      Education: graduated from [employer]; Tax adviserbachelor in psychology.      Tobacco; none      Alcohol:  Socially; 1 drink per week      Drugs: none      Exercise: hiking a lot in 2018      Seatbelt: 100%; texting none      Sexual activity: total partners = 15; no STDs; condoms 50-75%    Review of Systems  Constitutional: Positive for fever and malaise/fatigue.  HENT: Negative.   Eyes: Negative.  Negative for blurred vision and double vision.  Respiratory: Positive for cough. Negative for shortness of breath.   Cardiovascular: Negative.  Negative for chest pain.  Gastrointestinal: Negative.  Negative for abdominal pain, constipation, diarrhea, nausea and vomiting.  Genitourinary: Negative.   Musculoskeletal: Negative.   Skin: Negative.   Neurological: Positive for sensory change. Negative for dizziness, focal weakness, loss of consciousness and headaches.  Endo/Heme/Allergies: Negative.   Psychiatric/Behavioral: Negative.   All other systems reviewed and are negative.   Objective   Vitals as reported by the patient: There were no vitals filed for this visit.  Diagnoses and all orders for this visit:  Essential hypertension -     lisinopril (ZESTRIL) 10 MG tablet; TAKE 1 TABLET(10 MG) BY MOUTH DAILY  Suspected Covid-19 Virus Infection    PLAN  Restart Lisinopril 10mg  PO qd - once his COVID test returns, we can plan follow up. If it is negative, he will present to our office in around 2 weeks for BP check and possible dose change. If his test is positive, he will have to wait on a BP check until he is symptom free for 72 hours.  Otherwise, discussed supportive care. Rest, fluids, social isolation paramount.   Patient encouraged to call clinic with any questions, comments, or concerns.    I discussed the assessment and treatment plan with the patient. The patient was provided an opportunity to  ask questions and all were answered. The patient agreed with the plan and demonstrated an understanding of the instructions.   The patient was advised to call back or seek an in-person evaluation if the symptoms worsen or if the condition fails to improve as anticipated.  I provided 14 minutes of non-face-to-face time during this encounter.  Maximiano Coss, NP  Primary Care at [facility]

## 2019-05-18 ENCOUNTER — Other Ambulatory Visit: Payer: Self-pay | Admitting: Registered Nurse

## 2019-05-18 DIAGNOSIS — I10 Essential (primary) hypertension: Secondary | ICD-10-CM

## 2019-08-18 ENCOUNTER — Other Ambulatory Visit: Payer: Self-pay | Admitting: Registered Nurse

## 2019-08-18 DIAGNOSIS — I10 Essential (primary) hypertension: Secondary | ICD-10-CM

## 2019-08-18 NOTE — Telephone Encounter (Signed)
Another one to Dr. Leretha Pol. I'm going to be working with IT specialist Mr. Ferdie Ping today so I will make sure to bring this up with him as well.  Thank you  Jari Sportsman, NP

## 2019-08-18 NOTE — Telephone Encounter (Signed)
Your patient 

## 2019-11-07 ENCOUNTER — Ambulatory Visit (INDEPENDENT_AMBULATORY_CARE_PROVIDER_SITE_OTHER): Payer: 59 | Admitting: Registered Nurse

## 2019-11-07 ENCOUNTER — Other Ambulatory Visit (HOSPITAL_COMMUNITY)
Admission: RE | Admit: 2019-11-07 | Discharge: 2019-11-07 | Disposition: A | Discharge status: Home / Self Care | Payer: 59 | Source: Ambulatory Visit | Attending: Registered Nurse | Admitting: Registered Nurse

## 2019-11-07 ENCOUNTER — Other Ambulatory Visit: Payer: Self-pay

## 2019-11-07 ENCOUNTER — Encounter: Payer: Self-pay | Admitting: Registered Nurse

## 2019-11-07 VITALS — BP 126/84 | HR 77 | Temp 99.0°F | Ht 69.5 in | Wt 164.0 lb

## 2019-11-07 DIAGNOSIS — Z113 Encounter for screening for infections with a predominantly sexual mode of transmission: Secondary | ICD-10-CM | POA: Diagnosis not present

## 2019-11-07 DIAGNOSIS — Z13220 Encounter for screening for lipoid disorders: Secondary | ICD-10-CM

## 2019-11-07 DIAGNOSIS — Z130 Encounter for screening for diseases of the blood and blood-forming organs and certain disorders involving the immune mechanism: Secondary | ICD-10-CM

## 2019-11-07 DIAGNOSIS — Z0001 Encounter for general adult medical examination with abnormal findings: Secondary | ICD-10-CM

## 2019-11-07 DIAGNOSIS — Z114 Encounter for screening for human immunodeficiency virus [HIV]: Secondary | ICD-10-CM

## 2019-11-07 DIAGNOSIS — Z1329 Encounter for screening for other suspected endocrine disorder: Secondary | ICD-10-CM

## 2019-11-07 DIAGNOSIS — W57XXXA Bitten or stung by nonvenomous insect and other nonvenomous arthropods, initial encounter: Secondary | ICD-10-CM | POA: Diagnosis not present

## 2019-11-07 DIAGNOSIS — F5104 Psychophysiologic insomnia: Secondary | ICD-10-CM

## 2019-11-07 DIAGNOSIS — Z0000 Encounter for general adult medical examination without abnormal findings: Secondary | ICD-10-CM

## 2019-11-07 DIAGNOSIS — Z13228 Encounter for screening for other metabolic disorders: Secondary | ICD-10-CM

## 2019-11-07 MED ORDER — TRAZODONE HCL 50 MG PO TABS: 25.0000 mg | ORAL_TABLET | Freq: Every evening | ORAL | 3 refills | Status: DC | PRN

## 2019-11-07 NOTE — Progress Notes (Signed)
Established Patient Office Visit  Subjective:  Patient ID: Patrick Graham, male    DOB: 05-04-1986  Age: 34 y.o. MRN: [id number]  CC:  Chief Complaint  Patient presents with  . Annual Exam    also requesting std screening, does not feel he has been recently exposed. Recently had tick bite, brought the tick to show pcp.    HPI Patrick Graham presents for CPE  Has a few concerns he would like to address:  Tick bite: noticed two days ago, tick had just latched, still very small, no blood. He brought the tick with him in plastic bag, appears as adult male deer tick with little to no blood ingested.   Rectal mass: reducible, irritated rectal mass appearing intermittently over previous year or so, somewhat increasing in frequency. No blood in stool or melena, no blood when wiping. No history of hemorrhoids, but he feels that this is likely what is occurring. No fam hx of colon ca. No sxs of anemia. No hx of GI bleed. No hx of rectal prolapse.  Insomnia: trouble falling asleep and staying asleep. Notes that he snores, but states that he does not wake up gasping, no witnessed apnea, no waking with dry mouth, no waking with headaches, limited daytime sleepiness. 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 This has not helped him fall asleep and keeps him up when he wakes. Has been practicing good sleep hygiene, taking sleepy time tea, melatonin, and other nonpharm/OTC without relief. Has taken ambien in the past with good effect - 5mg  PO qhs PRN  Otherwise notes mild post-void dribbling, but not extremely concerned at this time - we will check labs and consider referral in the future. Past Medical History:  Diagnosis Date  . ADHD (attention deficit hyperactivity disorder)    diagnosed in middle school.  Previous use of Concerta, Adderall, Focalin  . Allergy   . Hypertension 07/28/2011   borderline    Past Surgical History:    Procedure Laterality Date  . WISDOM TOOTH EXTRACTION  2006    Family History  Problem Relation Age of Onset  . Cancer Mother        endometrial cancer  . Arthritis Mother        hip osteoarthritis    Social History   Socioeconomic History  . Marital status: Significant Other    Spouse name: Not on file  . Number of children: 1  . Years of education: Not on file  . Highest education level: Not on file  Occupational History  . Not on file  Tobacco Use  . Smoking status: Never Smoker  . Smokeless tobacco: Never Used  Substance and Sexual Activity  . Alcohol use: Yes    Comment: social  . Drug use: No  . Sexual activity: Yes  Other Topics Concern  . Not on file  Social History Narrative   Marital status: lives with partner and partner's daughter      Children:  None      Lives: alone; cats      Employment:  [employer]; started in April 2015; likes job. Third shift.      Education: graduated from May 2015; [employer] in psychology.      Tobacco; none      Alcohol:  Socially; 1 drink per week      Drugs: none      Exercise: hiking a lot in 2018  Seatbelt: 100%; texting none      Sexual activity: total partners = 15; no STDs; condoms 50-75%   Social Determinants of Health   Financial Resource Strain:   . Difficulty of Paying Living Expenses:   Food Insecurity:   . Worried About Programme researcher, broadcasting/film/video in the Last Year:   . Barista in the Last Year:   Transportation Needs:   . Freight forwarder (Medical):   Marland Kitchen Lack of Transportation (Non-Medical):   Physical Activity:   . Days of Exercise per Week:   . Minutes of Exercise per Session:   Stress:   . Feeling of Stress :   Social Connections:   . Frequency of Communication with Friends and Family:   . Frequency of Social Gatherings with Friends and Family:   . Attends Religious Services:   . Active Member of Clubs or Organizations:   . Attends Banker Meetings:   Marland Kitchen Marital  Status:   Intimate Partner Violence:   . Fear of Current or Ex-Partner:   . Emotionally Abused:   Marland Kitchen Physically Abused:   . Sexually Abused:     Outpatient Medications Prior to Visit  Medication Sig Dispense Refill  . Cetirizine HCl (ZYRTEC PO) Take by mouth daily.    . famotidine (PEPCID) 10 MG tablet Take 10 mg by mouth 2 (two) times daily.    . fluticasone (FLONASE) 50 MCG/ACT nasal spray SHAKE LIQUID AND USE 2 SPRAYS IN EACH NOSTRIL DAILY 16 g 0  . ibuprofen (ADVIL,MOTRIN) 200 MG tablet Take 600 mg by mouth every 8 (eight) hours as needed for headache or moderate pain.    Marland Kitchen ibuprofen (ADVIL,MOTRIN) 600 MG tablet Take 1 tablet (600 mg total) by mouth 3 (three) times daily after meals. 30 tablet 0  . lisinopril (ZESTRIL) 10 MG tablet TAKE 1 TABLET(10 MG) BY MOUTH DAILY 90 tablet 0  . omeprazole (PRILOSEC) 40 MG capsule Take 40 mg by mouth daily.     No facility-administered medications prior to visit.    Allergies  Allergen Reactions  . Amoxicillin Nausea And Vomiting    ROS Review of Systems  Constitutional: Negative.   HENT: Negative.   Eyes: Negative.   Respiratory: Negative.   Cardiovascular: Negative.   Gastrointestinal: Negative.   Endocrine: Negative.   Genitourinary: Negative.   Musculoskeletal: Negative.   Skin: Negative.   Allergic/Immunologic: Negative.   Neurological: Negative.   Hematological: Negative.   Psychiatric/Behavioral: Positive for sleep disturbance.  All other systems reviewed and are negative.     Objective:    Physical Exam  Constitutional: He is oriented to person, place, and time. He appears well-developed and well-nourished. No distress.  HENT:  Head: Normocephalic and atraumatic.  Right Ear: External ear normal.  Left Ear: External ear normal.  Nose: Nose normal.  Mouth/Throat: Oropharynx is clear and moist. No oropharyngeal exudate.  Eyes: Pupils are equal, round, and reactive to light. Conjunctivae and EOM are normal. Right eye  exhibits no discharge. Left eye exhibits no discharge. No scleral icterus.  Neck: No JVD present. No tracheal deviation present. No thyromegaly present.  Cardiovascular: Normal rate, regular rhythm, normal heart sounds and intact distal pulses. Exam reveals no gallop and no friction rub.  No murmur heard. Pulmonary/Chest: Effort normal and breath sounds normal. No respiratory distress. He has no wheezes. He has no rales. He exhibits no tenderness.  Abdominal: Soft. Bowel sounds are normal. He exhibits no distension and no mass. There is  abdominal tenderness (mild lrq upon deep palpation). There is no rebound and no guarding.  Musculoskeletal:        General: No tenderness, deformity or edema. Normal range of motion.     Cervical back: Normal range of motion and neck supple.  Lymphadenopathy:    He has no cervical adenopathy.  Neurological: He is alert and oriented to person, place, and time. No cranial nerve deficit. He exhibits normal muscle tone. Coordination normal.  Skin: Skin is warm and dry. No rash noted. He is not diaphoretic. No erythema. No pallor.  Psychiatric: He has a normal mood and affect. His behavior is normal. Judgment and thought content normal.  Nursing note and vitals reviewed.   BP 126/84   Pulse 77   Temp 99 F (37.2 C)   Ht 5' 9.5" (1.765 m)   Wt 164 lb (74.4 kg)   SpO2 97%   BMI 23.87 kg/m  Wt Readings from Last 3 Encounters:  11/07/19 164 lb (74.4 kg)  05/18/17 168 lb (76.2 kg)  03/09/17 172 lb (78 kg)     There are no preventive care reminders to display for this patient.  There are no preventive care reminders to display for this patient.  Lab Results  Component Value Date   TSH 1.100 03/09/2017   Lab Results  Component Value Date   WBC 5.3 03/09/2017   HGB 14.6 03/09/2017   HCT 43.4 03/09/2017   MCV 90 03/09/2017   PLT 220 03/09/2017   Lab Results  Component Value Date   NA 143 03/09/2017   K 4.0 03/09/2017   CO2 24 03/09/2017    GLUCOSE 88 03/09/2017   BUN 11 03/09/2017   CREATININE 0.72 (L) 03/09/2017   BILITOT 0.4 03/09/2017   ALKPHOS 64 03/09/2017   AST 12 03/09/2017   ALT 16 03/09/2017   PROT 6.9 03/09/2017   ALBUMIN 4.6 03/09/2017   CALCIUM 9.5 03/09/2017   Lab Results  Component Value Date   CHOL 124 03/09/2017   Lab Results  Component Value Date   HDL 56 03/09/2017   Lab Results  Component Value Date   LDLCALC 48 03/09/2017   Lab Results  Component Value Date   TRIG 99 03/09/2017   Lab Results  Component Value Date   CHOLHDL 2.2 03/09/2017   Lab Results  Component Value Date   HGBA1C 5.0 03/09/2017      Assessment & Plan:   Problem List Items Addressed This Visit    None    Visit Diagnoses    Tick bite, initial encounter    -  Primary   Relevant Orders   Lyme Ab/Western Blot Reflex   Rickettsial Fever Group IgG/M   Screening for STD (sexually transmitted disease)       Relevant Orders   Urine cytology ancillary only   Encounter for screening for HIV       Relevant Orders   HIV Antibody (routine testing w rflx)   Screening for endocrine, metabolic and immunity disorder       Relevant Orders   CBC With Differential   TSH   Comprehensive metabolic panel   Hemoglobin A1c   Lipid screening       Relevant Orders   Lipid panel      No orders of the defined types were placed in this encounter.   Follow-up: Return in about 1 year (around 11/06/2020).   PLAN  Trazodone for sleep  Labs drawn, including those for tick borne illnesses,  will follow up as warranted  Mild tenderness to RLQ of abdomen on deep palpation, no clear etiology, no fever or other signs of appendicitis. Will continue to monitor. Pt reports some hard stools with hemorrhoid, suggest stool softener - fiber supplement  Suggest otc hydrocortisone cream applied 1-2 times daily to rectum to help reduce hemorrhoids. Also suggested nonpharm  Otherwise unremarkable exam  Patient encouraged to call clinic  with any questions, comments, or concerns.  Janeece Agee, NP

## 2019-11-09 LAB — URINE CYTOLOGY ANCILLARY ONLY
Chlamydia: NEGATIVE
Comment: NEGATIVE
Comment: NEGATIVE
Comment: NORMAL
Neisseria Gonorrhea: NEGATIVE
Trichomonas: NEGATIVE

## 2019-11-12 LAB — COMPREHENSIVE METABOLIC PANEL
ALT: 30 IU/L (ref 0–44)
AST: 21 IU/L (ref 0–40)
Albumin/Globulin Ratio: 2.1 (ref 1.2–2.2)
Albumin: 4.7 g/dL (ref 4.0–5.0)
Alkaline Phosphatase: 59 IU/L (ref 39–117)
BUN/Creatinine Ratio: 16 (ref 9–20)
BUN: 11 mg/dL (ref 6–20)
Bilirubin Total: 0.4 mg/dL (ref 0.0–1.2)
CO2: 24 mmol/L (ref 20–29)
Calcium: 9.5 mg/dL (ref 8.7–10.2)
Chloride: 105 mmol/L (ref 96–106)
Creatinine, Ser: 0.67 mg/dL — ABNORMAL LOW (ref 0.76–1.27)
GFR calc Af Amer: 145 mL/min/{1.73_m2} (ref >59)
GFR calc non Af Amer: 125 mL/min/{1.73_m2} (ref >59)
Globulin, Total: 2.2 g/dL (ref 1.5–4.5)
Glucose: 88 mg/dL (ref 65–99)
Potassium: 4.1 mmol/L (ref 3.5–5.2)
Sodium: 142 mmol/L (ref 134–144)
Total Protein: 6.9 g/dL (ref 6.0–8.5)

## 2019-11-12 LAB — CBC WITH DIFFERENTIAL
Basophils Absolute: 0.1 10*3/uL (ref 0.0–0.2)
Basos: 1 %
EOS (ABSOLUTE): 0.5 10*3/uL — ABNORMAL HIGH (ref 0.0–0.4)
Eos: 7 %
Hematocrit: 43.7 % (ref 37.5–51.0)
Hemoglobin: 14.9 g/dL (ref 13.0–17.7)
Immature Grans (Abs): 0 10*3/uL (ref 0.0–0.1)
Immature Granulocytes: 0 %
Lymphocytes Absolute: 1.9 10*3/uL (ref 0.7–3.1)
Lymphs: 30 %
MCH: 30.4 pg (ref 26.6–33.0)
MCHC: 34.1 g/dL (ref 31.5–35.7)
MCV: 89 fL (ref 79–97)
Monocytes Absolute: 0.5 10*3/uL (ref 0.1–0.9)
Monocytes: 9 %
Neutrophils Absolute: 3.3 10*3/uL (ref 1.4–7.0)
Neutrophils: 53 %
RBC: 4.9 x10E6/uL (ref 4.14–5.80)
RDW: 12.6 % (ref 11.6–15.4)
WBC: 6.2 10*3/uL (ref 3.4–10.8)

## 2019-11-12 LAB — LYME AB/WESTERN BLOT REFLEX
LYME DISEASE AB, QUANT, IGM: <0.8 index (ref 0.00–0.79)
Lyme IgG/IgM Ab: 0.95 {ISR} — ABNORMAL HIGH (ref 0.00–0.90)

## 2019-11-12 LAB — RICKETTSIAL FEVER GROUP IGG/M
Spotted Fever Group IgG: <1:64 {titer}
Spotted Fever Group IgM: <1:64 {titer}
Typhus Fever Group IgG: <1:64 {titer}
Typhus Fever Group IgM: <1:64 {titer}

## 2019-11-12 LAB — LYME, WESTERN BLOT, SERUM (REFLEXED)
IgG P18 Ab.: ABSENT
IgG P23 Ab.: ABSENT
IgG P28 Ab.: ABSENT
IgG P30 Ab.: ABSENT
IgG P39 Ab.: ABSENT
IgG P41 Ab.: ABSENT
IgG P45 Ab.: ABSENT
IgG P58 Ab.: ABSENT
IgG P66 Ab.: ABSENT
IgG P93 Ab.: ABSENT
IgM P23 Ab.: ABSENT
IgM P39 Ab.: ABSENT
IgM P41 Ab.: ABSENT
Lyme IgG Wb: NEGATIVE
Lyme IgM Wb: NEGATIVE

## 2019-11-12 LAB — HEMOGLOBIN A1C
Est. average glucose Bld gHb Est-mCnc: 94 mg/dL
Hgb A1c MFr Bld: 4.9 % (ref 4.8–5.6)

## 2019-11-12 LAB — LIPID PANEL
Chol/HDL Ratio: 2.8 ratio (ref 0.0–5.0)
Cholesterol, Total: 137 mg/dL (ref 100–199)
HDL: 49 mg/dL (ref >39)
LDL Chol Calc (NIH): 65 mg/dL (ref 0–99)
Triglycerides: 132 mg/dL (ref 0–149)
VLDL Cholesterol Cal: 23 mg/dL (ref 5–40)

## 2019-11-12 LAB — HIV ANTIBODY (ROUTINE TESTING W REFLEX): HIV Screen 4th Generation wRfx: NONREACTIVE

## 2019-11-12 LAB — TSH: TSH: 0.821 u[IU]/mL (ref 0.450–4.500)

## 2019-11-13 ENCOUNTER — Encounter: Payer: Self-pay | Admitting: Registered Nurse

## 2019-11-25 ENCOUNTER — Other Ambulatory Visit: Payer: Self-pay | Admitting: Registered Nurse

## 2019-11-25 DIAGNOSIS — I10 Essential (primary) hypertension: Secondary | ICD-10-CM

## 2020-02-25 ENCOUNTER — Other Ambulatory Visit: Payer: Self-pay | Admitting: Registered Nurse

## 2020-02-25 DIAGNOSIS — F5104 Psychophysiologic insomnia: Secondary | ICD-10-CM

## 2020-02-25 DIAGNOSIS — I10 Essential (primary) hypertension: Secondary | ICD-10-CM

## 2020-03-01 ENCOUNTER — Ambulatory Visit: Payer: 59 | Admitting: Registered Nurse

## 2020-03-01 ENCOUNTER — Other Ambulatory Visit: Payer: Self-pay

## 2020-03-01 ENCOUNTER — Encounter: Payer: Self-pay | Admitting: Registered Nurse

## 2020-03-01 ENCOUNTER — Telehealth: Payer: Self-pay | Admitting: Registered Nurse

## 2020-03-01 VITALS — BP 129/87 | HR 77 | Temp 98.1°F | Resp 18 | Ht 69.5 in | Wt 192.4 lb

## 2020-03-01 DIAGNOSIS — F5104 Psychophysiologic insomnia: Secondary | ICD-10-CM

## 2020-03-01 MED ORDER — TEMAZEPAM 15 MG PO CAPS: 15.0000 mg | ORAL_CAPSULE | Freq: Every evening | ORAL | 0 refills | Status: DC | PRN

## 2020-03-01 MED ORDER — OMEPRAZOLE 40 MG PO CPDR: 40.0000 mg | DELAYED_RELEASE_CAPSULE | Freq: Every day | ORAL | 3 refills | Status: DC

## 2020-03-01 MED ORDER — TEMAZEPAM 15 MG PO CAPS: 15.0000 mg | ORAL_CAPSULE | Freq: Every evening | ORAL | 0 refills | Status: AC | PRN

## 2020-03-01 NOTE — Telephone Encounter (Signed)
Ordered 4 months worth for pt

## 2020-03-01 NOTE — Telephone Encounter (Signed)
This pt was seen by Janeece Agee today.   He is prescribed omeprazole 40 mg.   I checked his chart and it's listed but there's not an order for it sent to the pharmacy for amount and refills.  Thanks for checking on this.

## 2020-03-01 NOTE — Patient Instructions (Signed)
° ° ° °  If you have lab work done today you will be contacted with your lab results within the next 2 weeks.  If you have not heard from us then please contact us. The fastest way to get your results is to register for My Chart. ° ° °IF you received an x-ray today, you will receive an invoice from [facility]. Please contact [facility] at [phone] with questions or concerns regarding your invoice.  ° °IF you received labwork today, you will receive an invoice from [facility]. Please contact [facility] at [phone] with questions or concerns regarding your invoice.  ° °Our billing staff will not be able to assist you with questions regarding bills from these companies. ° °You will be contacted with the lab results as soon as they are available. The fastest way to get your results is to activate your My Chart account. Instructions are located on the last page of this paperwork. If you have not heard from us regarding the results in 2 weeks, please contact this office. °  ° ° ° °

## 2020-03-01 NOTE — Progress Notes (Signed)
Established Patient Office Visit  Subjective:  Patient ID: Patrick Graham, male    DOB: [birth year]  Age: 34 y.o. MRN: [id number]  CC:  Chief Complaint  Patient presents with  . Insomnia    patient states he is still having problems sleeping , and when he does go to sleep he is right back up. Patient states he was prescribed Trazodone but it does noy seem to help anymore.    HPI Patrick Graham presents for sleep disturbance.  Works night shift. Has good sleep hygiene. Typically tries to fall asleep around 6:45-7am and sleep for 6-8 hours. Lately having racing thoughts and having a tough time falling asleep before 830-900. Worse on days off, but still a lot of disturbance on work days as well. Has been on trazodone 50mg  PO qhs but this does not seem to provide any effect whatsoever anymore.    Past Medical History:  Diagnosis Date  . ADHD (attention deficit hyperactivity disorder)    diagnosed in middle school.  Previous use of Concerta, Adderall, Focalin  . Allergy   . Hypertension 07/28/2011   borderline    Past Surgical History:  Procedure Laterality Date  . WISDOM TOOTH EXTRACTION  2006    Family History  Problem Relation Age of Onset  . Cancer Mother        endometrial cancer  . Arthritis Mother        hip osteoarthritis    Social History   Socioeconomic History  . Marital status: Significant Other    Spouse name: Not on file  . Number of children: 1  . Years of education: Not on file  . Highest education level: Not on file  Occupational History  . Not on file  Tobacco Use  . Smoking status: Never Smoker  . Smokeless tobacco: Never Used  Vaping Use  . Vaping Use: Never used  Substance and Sexual Activity  . Alcohol use: Yes    Comment: social  . Drug use: No  . Sexual activity: Yes  Other Topics Concern  . Not on file  Social History Narrative   Marital status: lives with partner and partner's daughter      Children:  None      Lives: alone;  cats      Employment:  [employer]; started in April 2015; likes job. Third shift.      Education: graduated from May 2015; [employer] in psychology.      Tobacco; none      Alcohol:  Socially; 1 drink per week      Drugs: none      Exercise: hiking a lot in 2018      Seatbelt: 100%; texting none      Sexual activity: total partners = 15; no STDs; condoms 50-75%   Social Determinants of Health   Financial Resource Strain:   . Difficulty of Paying Living Expenses:   Food Insecurity:   . Worried About 2019 in the Last Year:   . Programme researcher, broadcasting/film/video in the Last Year:   Transportation Needs:   . Barista (Medical):   Freight forwarder Lack of Transportation (Non-Medical):   Physical Activity:   . Days of Exercise per Week:   . Minutes of Exercise per Session:   Stress:   . Feeling of Stress :   Social Connections:   . Frequency of Communication with Friends and Family:   . Frequency of Social Gatherings with Friends and Family:   .  Attends Religious Services:   . Active Member of Clubs or Organizations:   . Attends Banker Meetings:   Marland Kitchen Marital Status:   Intimate Partner Violence:   . Fear of Current or Ex-Partner:   . Emotionally Abused:   Marland Kitchen Physically Abused:   . Sexually Abused:     Outpatient Medications Prior to Visit  Medication Sig Dispense Refill  . Cetirizine HCl (ZYRTEC PO) Take by mouth daily.    . fluticasone (FLONASE) 50 MCG/ACT nasal spray SHAKE LIQUID AND USE 2 SPRAYS IN EACH NOSTRIL DAILY 16 g 0  . ibuprofen (ADVIL,MOTRIN) 200 MG tablet Take 600 mg by mouth every 8 (eight) hours as needed for headache or moderate pain.    Marland Kitchen ibuprofen (ADVIL,MOTRIN) 600 MG tablet Take 1 tablet (600 mg total) by mouth 3 (three) times daily after meals. 30 tablet 0  . lisinopril (ZESTRIL) 10 MG tablet TAKE 1 TABLET(10 MG) BY MOUTH DAILY 90 tablet 0  . omeprazole (PRILOSEC) 40 MG capsule Take 40 mg by mouth daily.    . traZODone (DESYREL) 50 MG tablet  TAKE 1/2 TO 1 TABLET(25 TO 50 MG) BY MOUTH AT BEDTIME AS NEEDED FOR SLEEP 30 tablet 3  . famotidine (PEPCID) 10 MG tablet Take 10 mg by mouth 2 (two) times daily. (Patient not taking: Reported on 03/01/2020)     No facility-administered medications prior to visit.    Allergies  Allergen Reactions  . Amoxicillin Nausea And Vomiting    ROS Review of Systems  Constitutional: Negative.   HENT: Negative.   Eyes: Negative.   Respiratory: Negative.   Cardiovascular: Negative.   Gastrointestinal: Negative.   Endocrine: Negative.   Genitourinary: Negative.   Musculoskeletal: Negative.   Skin: Negative.   Allergic/Immunologic: Negative.   Neurological: Negative.   Hematological: Negative.   Psychiatric/Behavioral: Positive for sleep disturbance. Negative for agitation, behavioral problems, confusion, decreased concentration, dysphoric mood, hallucinations, self-injury and suicidal ideas. The patient is nervous/anxious. The patient is not hyperactive.   All other systems reviewed and are negative.     Objective:    Physical Exam Vitals and nursing note reviewed.  Constitutional:      General: He is not in acute distress.    Appearance: Normal appearance. He is normal weight. He is not ill-appearing, toxic-appearing or diaphoretic.  Cardiovascular:     Rate and Rhythm: Normal rate and regular rhythm.  Pulmonary:     Effort: Pulmonary effort is normal. No respiratory distress.  Neurological:     General: No focal deficit present.     Mental Status: He is alert and oriented to person, place, and time. Mental status is at baseline.  Psychiatric:        Mood and Affect: Mood normal.        Behavior: Behavior normal.        Thought Content: Thought content normal.        Judgment: Judgment normal.     BP 129/87   Pulse 77   Temp 98.1 F (36.7 C) (Temporal)   Resp 18   Ht 5' 9.5" (1.765 m)   Wt 192 lb 6.4 oz (87.3 kg)   SpO2 97%   BMI 28.01 kg/m  Wt Readings from Last 3  Encounters:  03/01/20 192 lb 6.4 oz (87.3 kg)  11/07/19 164 lb (74.4 kg)  05/18/17 168 lb (76.2 kg)     Health Maintenance Due  Topic Date Due  . Hepatitis C Screening  Never done  . INFLUENZA  VACCINE  02/25/2020    There are no preventive care reminders to display for this patient.  Lab Results  Component Value Date   TSH 0.821 11/07/2019   Lab Results  Component Value Date   WBC 6.2 11/07/2019   HGB 14.9 11/07/2019   HCT 43.7 11/07/2019   MCV 89 11/07/2019   PLT 220 03/09/2017   Lab Results  Component Value Date   NA 142 11/07/2019   K 4.1 11/07/2019   CO2 24 11/07/2019   GLUCOSE 88 11/07/2019   BUN 11 11/07/2019   CREATININE 0.67 (L) 11/07/2019   BILITOT 0.4 11/07/2019   ALKPHOS 59 11/07/2019   AST 21 11/07/2019   ALT 30 11/07/2019   PROT 6.9 11/07/2019   ALBUMIN 4.7 11/07/2019   CALCIUM 9.5 11/07/2019   Lab Results  Component Value Date   CHOL 137 11/07/2019   Lab Results  Component Value Date   HDL 49 11/07/2019   Lab Results  Component Value Date   LDLCALC 65 11/07/2019   Lab Results  Component Value Date   TRIG 132 11/07/2019   Lab Results  Component Value Date   CHOLHDL 2.8 11/07/2019   Lab Results  Component Value Date   HGBA1C 4.9 11/07/2019      Assessment & Plan:   Problem List Items Addressed This Visit    None    Visit Diagnoses    Psychophysiological insomnia    -  Primary   Relevant Medications   temazepam (RESTORIL) 15 MG capsule   temazepam (RESTORIL) 15 MG capsule (Start on 03/29/2020)   temazepam (RESTORIL) 15 MG capsule (Start on 04/26/2020)      No orders of the defined types were placed in this encounter.   Follow-up: No follow-ups on file.   PLAN  Stop trazodone  Start restoril 15mg  PO qhs  May increase to 30mg  PO qhs if suboptimal effect  Follow up in 3 mos  Patient encouraged to call clinic with any questions, comments, or concerns.  , NP

## 2020-03-01 NOTE — Telephone Encounter (Signed)
Patient was seen today by PCP and was advised omeprazole (PRILOSEC) 40 MG capsule would be sent to pharmacy, please advise

## 2020-04-05 ENCOUNTER — Telehealth: Payer: Self-pay | Admitting: Registered Nurse

## 2020-04-05 DIAGNOSIS — F5104 Psychophysiologic insomnia: Secondary | ICD-10-CM

## 2020-04-05 NOTE — Telephone Encounter (Signed)
Medication Refill - Medication: temazepam   Has the patient contacted their pharmacy? Yes.   (Agent: If no, request that the patient contact the pharmacy for the refill.) (Agent: If yes, when and what did the pharmacy advise?)  Preferred Pharmacy (with phone number or street name):  [id number] [place], [place] - [address] AT [facility] [address] [facility] [address] [facility] [address]  [address] [place] [address]  Phone: [phone] Fax: [phone]  Hours: Not open 24 hours     Agent: Please be advised that RX refills may take up to 3 business days. We ask that you follow-up with your pharmacy.

## 2020-04-05 NOTE — Telephone Encounter (Signed)
Refill already at the pharmacy.

## 2020-05-31 ENCOUNTER — Other Ambulatory Visit: Payer: Self-pay | Admitting: Registered Nurse

## 2020-05-31 DIAGNOSIS — I10 Essential (primary) hypertension: Secondary | ICD-10-CM

## 2020-06-30 ENCOUNTER — Other Ambulatory Visit: Payer: Self-pay | Admitting: Registered Nurse

## 2020-06-30 DIAGNOSIS — F5104 Psychophysiologic insomnia: Secondary | ICD-10-CM

## 2020-06-30 NOTE — Telephone Encounter (Signed)
Requested Prescriptions  Pending Prescriptions Disp Refills  . traZODone (DESYREL) 50 MG tablet [Pharmacy Med Name: TRAZODONE 50MG  TABLETS] 30 tablet 0    Sig: TAKE 1/2 TO 1 TABLET(25 TO 50 MG) BY MOUTH AT BEDTIME AS NEEDED FOR SLEEP     Psychiatry: Antidepressants - Serotonin Modulator Passed - 06/30/2020  7:10 AM      Passed - Completed PHQ-2 or PHQ-9 in the last 360 days      Passed - Valid encounter within last 6 months    Recent Outpatient Visits          4 months ago Psychophysiological insomnia   [facility], Shelbie Ammons, NP   7 months ago Tick bite, initial encounter   [facility], Shelbie Ammons, NP   1 year ago Essential hypertension   [facility], Shelbie Ammons, NP   1 year ago Encounter to establish care   [facility], Shelbie Ammons, NP   3 years ago Wound infection   [facility] Care at [facility], [place], [place]

## 2020-07-15 ENCOUNTER — Other Ambulatory Visit: Payer: Self-pay

## 2020-07-15 ENCOUNTER — Encounter: Payer: Self-pay | Admitting: Registered Nurse

## 2020-07-15 ENCOUNTER — Ambulatory Visit: Payer: 59 | Admitting: Registered Nurse

## 2020-07-15 VITALS — BP 144/84 | HR 96 | Temp 98.0°F | Resp 18 | Ht 69.4 in | Wt 184.4 lb

## 2020-07-15 DIAGNOSIS — B829 Intestinal parasitism, unspecified: Secondary | ICD-10-CM | POA: Diagnosis not present

## 2020-07-15 LAB — CBC WITH DIFFERENTIAL
Basophils Absolute: 0.1 10*3/uL (ref 0.0–0.2)
Basos: 1 %
EOS (ABSOLUTE): 0.2 10*3/uL (ref 0.0–0.4)
Eos: 3 %
Hematocrit: 44.4 % (ref 37.5–51.0)
Hemoglobin: 15.6 g/dL (ref 13.0–17.7)
Immature Grans (Abs): 0 10*3/uL (ref 0.0–0.1)
Immature Granulocytes: 0 %
Lymphocytes Absolute: 2.8 10*3/uL (ref 0.7–3.1)
Lymphs: 47 %
MCH: 30.8 pg (ref 26.6–33.0)
MCHC: 35.1 g/dL (ref 31.5–35.7)
MCV: 88 fL (ref 79–97)
Monocytes Absolute: 0.6 10*3/uL (ref 0.1–0.9)
Monocytes: 10 %
Neutrophils Absolute: 2.3 10*3/uL (ref 1.4–7.0)
Neutrophils: 39 %
RBC: 5.06 x10E6/uL (ref 4.14–5.80)
RDW: 11.3 % — ABNORMAL LOW (ref 11.6–15.4)
WBC: 6 10*3/uL (ref 3.4–10.8)

## 2020-07-15 NOTE — Patient Instructions (Signed)
° ° ° °  If you have lab work done today you will be contacted with your lab results within the next 2 weeks.  If you have not heard from us then please contact us. The fastest way to get your results is to register for My Chart. ° ° °IF you received an x-ray today, you will receive an invoice from [facility]. Please contact [facility] at [phone] with questions or concerns regarding your invoice.  ° °IF you received labwork today, you will receive an invoice from [facility]. Please contact [facility] at [phone] with questions or concerns regarding your invoice.  ° °Our billing staff will not be able to assist you with questions regarding bills from these companies. ° °You will be contacted with the lab results as soon as they are available. The fastest way to get your results is to activate your My Chart account. Instructions are located on the last page of this paperwork. If you have not heard from us regarding the results in 2 weeks, please contact this office. °  ° ° ° °

## 2020-07-15 NOTE — Addendum Note (Signed)
Addended by: Argentina Ponder on: 07/15/2020 12:06 PM   Modules accepted: Orders

## 2020-07-15 NOTE — Progress Notes (Signed)
Acute Office Visit  Subjective:    Patient ID: Patrick Graham, male    DOB: August 01, 1985, 34 y.o.   MRN: [id number]  Chief Complaint  Patient presents with  . parasite    Has found a piece of a tapeworm on Saturday. And has noticed an appetite change and some depression Phq9=11     HPI Patient is in today for concern for tapeworm  Was applying hemorrhoid cream on Sunday, found foreign body on rectum. Described as pale, about 1-2cm long, about 1cm wide. Somewhat rounded. No recent diet suggestive of this piece.  Brought piece with him - states it is shriveled and darker than it was when finding it.   Has been having some loose stools over past 1-2 days  No known fecal-oral contamination. Has a dog who takes tapeworm meds monthly. Has two cats - no known infection, has automatic litter box so has not seen their stool. No one else at home having symptoms. No history of parasite infection. Does have sushi but reports it is from reputable sources and no concern from these locations in the past. Otherwise no raw/undercooked meat. States that [place] visiting family, but unsure that this is contributory.  Past Medical History:  Diagnosis Date  . ADHD (attention deficit hyperactivity disorder)    diagnosed in middle school.  Previous use of Concerta, Adderall, Focalin  . Allergy   . Hypertension 07/28/2011   borderline    Past Surgical History:  Procedure Laterality Date  . WISDOM TOOTH EXTRACTION  2006    Family History  Problem Relation Age of Onset  . Cancer Mother        endometrial cancer  . Arthritis Mother        hip osteoarthritis    Social History   Socioeconomic History  . Marital status: Significant Other    Spouse name: Not on file  . Number of children: 1  . Years of education: Not on file  . Highest education level: Not on file  Occupational History  . Not on file  Tobacco Use  . Smoking status:  Never Smoker  . Smokeless tobacco: Never Used  Vaping Use  . Vaping Use: Never used  Substance and Sexual Activity  . Alcohol use: Yes    Comment: social  . Drug use: No  . Sexual activity: Yes  Other Topics Concern  . Not on file  Social History Narrative   Marital status: lives with partner and partner's daughter      Children:  None      Lives: alone; cats      Employment:  [employer] [place]; started in April 2015; likes job. Third shift.      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      Tobacco; none      Alcohol:  Socially; 1 drink per week      Drugs: none      Exercise: hiking a lot in 2018      Seatbelt: 100%; texting none      Sexual activity: total partners = 15; no STDs; condoms 50-75%   Social Determinants of Health   Financial Resource Strain: Not on file  Food Insecurity: Not on file  Transportation Needs: Not on file  Physical Activity: Not on file  Stress: Not on file  Social Connections: Not on file  Intimate Partner Violence: Not on file    Outpatient Medications  Prior to Visit  Medication Sig Dispense Refill  . amphetamine-dextroamphetamine (ADDERALL) 10 MG tablet Take 10 mg by mouth daily with breakfast.    . Cetirizine HCl (ZYRTEC PO) Take by mouth daily.    . fluticasone (FLONASE) 50 MCG/ACT nasal spray SHAKE LIQUID AND USE 2 SPRAYS IN EACH NOSTRIL DAILY 16 g 0  . ibuprofen (ADVIL,MOTRIN) 200 MG tablet Take 600 mg by mouth every 8 (eight) hours as needed for headache or moderate pain.    Marland Kitchen ibuprofen (ADVIL,MOTRIN) 600 MG tablet Take 1 tablet (600 mg total) by mouth 3 (three) times daily after meals. 30 tablet 0  . lisinopril (ZESTRIL) 10 MG tablet TAKE 1 TABLET(10 MG) BY MOUTH DAILY 90 tablet 0  . omeprazole (PRILOSEC) 40 MG capsule Take 1 capsule (40 mg total) by mouth daily. 30 capsule 3  . temazepam (RESTORIL) 15 MG capsule Take 1 capsule (15 mg total) by mouth at bedtime as needed for sleep. 30 capsule 0  . temazepam  (RESTORIL) 15 MG capsule Take 1 capsule (15 mg total) by mouth at bedtime as needed for sleep. (Patient taking differently: Take 30 mg by mouth at bedtime as needed for sleep.) 30 capsule 0  . temazepam (RESTORIL) 15 MG capsule Take 1 capsule (15 mg total) by mouth at bedtime as needed for sleep. 30 capsule 0  . traZODone (DESYREL) 50 MG tablet TAKE 1/2 TO 1 TABLET(25 TO 50 MG) BY MOUTH AT BEDTIME AS NEEDED FOR SLEEP 90 tablet 0  . venlafaxine (EFFEXOR) 75 MG tablet Take 75 mg by mouth in the morning, at noon, and at bedtime.    . famotidine (PEPCID) 10 MG tablet Take 10 mg by mouth 2 (two) times daily. (Patient not taking: No sig reported)     No facility-administered medications prior to visit.    Allergies  Allergen Reactions  . Amoxicillin Nausea And Vomiting    Review of Systems Per hpi      Objective:    Physical Exam Vitals and nursing note reviewed.  Constitutional:      Appearance: Normal appearance.  Cardiovascular:     Rate and Rhythm: Normal rate and regular rhythm.  Pulmonary:     Effort: Pulmonary effort is normal. No respiratory distress.  Genitourinary:    Comments: Deferred by patient Skin:    General: Skin is warm and dry.     Findings: No lesion or rash.  Neurological:     General: No focal deficit present.     Mental Status: He is alert and oriented to person, place, and time. Mental status is at baseline.  Psychiatric:        Mood and Affect: Mood normal.        Behavior: Behavior normal.        Thought Content: Thought content normal.        Judgment: Judgment normal.     BP (!) 144/84   Pulse 96   Temp 98 F (36.7 C) (Temporal)   Resp 18   Ht 5' 9.4" (1.763 m)   Wt 184 lb 6.4 oz (83.6 kg)   SpO2 97%   BMI 26.92 kg/m  Wt Readings from Last 3 Encounters:  07/15/20 184 lb 6.4 oz (83.6 kg)  03/01/20 192 lb 6.4 oz (87.3 kg)  11/07/19 164 lb (74.4 kg)    There are no preventive care reminders to display for this patient.  There are no  preventive care reminders to display for this patient.   Lab Results  Component Value Date   TSH 0.821 11/07/2019   Lab Results  Component Value Date   WBC 6.2 11/07/2019   HGB 14.9 11/07/2019   HCT 43.7 11/07/2019   MCV 89 11/07/2019   PLT 220 03/09/2017   Lab Results  Component Value Date   NA 142 11/07/2019   K 4.1 11/07/2019   CO2 24 11/07/2019   GLUCOSE 88 11/07/2019   BUN 11 11/07/2019   CREATININE 0.67 (L) 11/07/2019   BILITOT 0.4 11/07/2019   ALKPHOS 59 11/07/2019   AST 21 11/07/2019   ALT 30 11/07/2019   PROT 6.9 11/07/2019   ALBUMIN 4.7 11/07/2019   CALCIUM 9.5 11/07/2019   Lab Results  Component Value Date   CHOL 137 11/07/2019   Lab Results  Component Value Date   HDL 49 11/07/2019   Lab Results  Component Value Date   LDLCALC 65 11/07/2019   Lab Results  Component Value Date   TRIG 132 11/07/2019   Lab Results  Component Value Date   CHOLHDL 2.8 11/07/2019   Lab Results  Component Value Date   HGBA1C 4.9 11/07/2019       Assessment & Plan:   Problem List Items Addressed This Visit   None   Visit Diagnoses    Intestinal parasitism, unspecified    -  Primary   Relevant Orders   Ova and parasite examination   CBC With Differential   Pathologist smear review   Stool culture       No orders of the defined types were placed in this encounter.  PLAN  [place] being biggest clue here - schistosomiasis r/t freshwater worms has apparently been an issue in [place] - while he did not have any dermatitis this may have become a chronic intestinal infection?  Will collect omp, stool culture, cbc w diff, and path smear review  Treat as findings indicate  Patient encouraged to call clinic with any questions, comments, or concerns.   Janeece Agee, NP

## 2020-07-16 LAB — ARTHROPOD IDENTIFICATION: Parasite ID, Arthropod: NONE SEEN

## 2020-07-17 LAB — PATHOLOGIST SMEAR REVIEW
Basophils Absolute: 0.1 10*3/uL (ref 0.0–0.2)
Basos: 2 %
EOS (ABSOLUTE): 0.1 10*3/uL (ref 0.0–0.4)
Eos: 2 %
Hematocrit: 44.7 % (ref 37.5–51.0)
Hemoglobin: 15.7 g/dL (ref 13.0–17.7)
Immature Grans (Abs): 0 10*3/uL (ref 0.0–0.1)
Immature Granulocytes: 0 %
Lymphocytes Absolute: 2.8 10*3/uL (ref 0.7–3.1)
Lymphs: 47 %
MCH: 30.8 pg (ref 26.6–33.0)
MCHC: 35.1 g/dL (ref 31.5–35.7)
MCV: 88 fL (ref 79–97)
Monocytes Absolute: 0.5 10*3/uL (ref 0.1–0.9)
Monocytes: 9 %
Neutrophils Absolute: 2.4 10*3/uL (ref 1.4–7.0)
Neutrophils: 40 %
Platelets: 251 10*3/uL (ref 150–450)
RBC: 5.09 x10E6/uL (ref 4.14–5.80)
RDW: 11.2 % — ABNORMAL LOW (ref 11.6–15.4)
WBC: 5.9 10*3/uL (ref 3.4–10.8)

## 2020-07-20 ENCOUNTER — Encounter: Payer: Self-pay | Admitting: Registered Nurse

## 2020-07-22 LAB — STOOL CULTURE: E coli, Shiga toxin Assay: NEGATIVE

## 2020-07-29 NOTE — Telephone Encounter (Signed)
Pt would like update if you have any

## 2020-07-30 ENCOUNTER — Other Ambulatory Visit: Payer: Self-pay | Admitting: Registered Nurse

## 2020-07-31 LAB — OVA AND PARASITE EXAMINATION

## 2020-08-29 ENCOUNTER — Other Ambulatory Visit: Payer: Self-pay | Admitting: Registered Nurse

## 2020-08-29 DIAGNOSIS — I10 Essential (primary) hypertension: Secondary | ICD-10-CM

## 2020-09-28 ENCOUNTER — Other Ambulatory Visit: Payer: Self-pay | Admitting: Registered Nurse

## 2020-09-28 DIAGNOSIS — F5104 Psychophysiologic insomnia: Secondary | ICD-10-CM

## 2020-09-28 NOTE — Telephone Encounter (Signed)
Requested Prescriptions  Pending Prescriptions Disp Refills  . traZODone (DESYREL) 50 MG tablet [Pharmacy Med Name: TRAZODONE 50MG  TABLETS] 90 tablet 0    Sig: TAKE 1/2 TO 1 TABLET(25 TO 50 MG) BY MOUTH AT BEDTIME AS NEEDED FOR SLEEP     Psychiatry: Antidepressants - Serotonin Modulator Passed - 09/28/2020  7:08 AM      Passed - Completed PHQ-2 or PHQ-9 in the last 360 days      Passed - Valid encounter within last 6 months    Recent Outpatient Visits          2 months ago Intestinal parasitism, unspecified   [facility] at 11/28/2020, Shelbie Ammons, NP   7 months ago Psychophysiological insomnia   [facility], Shelbie Ammons, NP   10 months ago Tick bite, initial encounter   [facility], Shelbie Ammons, NP   1 year ago Essential hypertension   [facility], Shelbie Ammons, NP   1 year ago Encounter to establish care   [facility], Shelbie Ammons, NP

## 2020-11-27 ENCOUNTER — Other Ambulatory Visit: Payer: Self-pay | Admitting: Registered Nurse

## 2020-11-27 DIAGNOSIS — I10 Essential (primary) hypertension: Secondary | ICD-10-CM

## 2020-12-27 ENCOUNTER — Ambulatory Visit: Payer: 59 | Admitting: Registered Nurse

## 2020-12-27 ENCOUNTER — Other Ambulatory Visit: Payer: Self-pay | Admitting: Registered Nurse

## 2020-12-27 DIAGNOSIS — F5104 Psychophysiologic insomnia: Secondary | ICD-10-CM

## 2020-12-27 DIAGNOSIS — Z0289 Encounter for other administrative examinations: Secondary | ICD-10-CM

## 2021-01-09 ENCOUNTER — Other Ambulatory Visit: Payer: Self-pay

## 2021-01-09 ENCOUNTER — Ambulatory Visit (INDEPENDENT_AMBULATORY_CARE_PROVIDER_SITE_OTHER): Payer: 59 | Admitting: Registered Nurse

## 2021-01-09 ENCOUNTER — Other Ambulatory Visit (HOSPITAL_COMMUNITY)
Admission: RE | Admit: 2021-01-09 | Discharge: 2021-01-09 | Disposition: A | Discharge status: Home / Self Care | Payer: 59 | Source: Ambulatory Visit | Attending: Registered Nurse | Admitting: Registered Nurse

## 2021-01-09 ENCOUNTER — Encounter: Payer: Self-pay | Admitting: Registered Nurse

## 2021-01-09 VITALS — BP 120/76 | HR 93 | Temp 98.3°F | Resp 18 | Ht 69.0 in | Wt 178.8 lb

## 2021-01-09 DIAGNOSIS — R238 Other skin changes: Secondary | ICD-10-CM | POA: Diagnosis not present

## 2021-01-09 DIAGNOSIS — Z113 Encounter for screening for infections with a predominantly sexual mode of transmission: Secondary | ICD-10-CM

## 2021-01-09 DIAGNOSIS — Q5522 Retractile testis: Secondary | ICD-10-CM

## 2021-01-09 DIAGNOSIS — R233 Spontaneous ecchymoses: Secondary | ICD-10-CM

## 2021-01-09 DIAGNOSIS — K5904 Chronic idiopathic constipation: Secondary | ICD-10-CM | POA: Diagnosis not present

## 2021-01-09 LAB — CBC WITH DIFFERENTIAL/PLATELET
Basophils Absolute: 0.1 10*3/uL (ref 0.0–0.1)
Basophils Relative: 1.2 % (ref 0.0–3.0)
Eosinophils Absolute: 0.3 10*3/uL (ref 0.0–0.7)
Eosinophils Relative: 4.3 % (ref 0.0–5.0)
HCT: 42.3 % (ref 39.0–52.0)
Hemoglobin: 14.6 g/dL (ref 13.0–17.0)
Lymphocytes Relative: 33.4 % (ref 12.0–46.0)
Lymphs Abs: 1.9 10*3/uL (ref 0.7–4.0)
MCHC: 34.7 g/dL (ref 30.0–36.0)
MCV: 88.3 fl (ref 78.0–100.0)
Monocytes Absolute: 0.5 10*3/uL (ref 0.1–1.0)
Monocytes Relative: 8.6 % (ref 3.0–12.0)
Neutro Abs: 3 10*3/uL (ref 1.4–7.7)
Neutrophils Relative %: 52.5 % (ref 43.0–77.0)
Platelets: 277 10*3/uL (ref 150.0–400.0)
RBC: 4.78 Mil/uL (ref 4.22–5.81)
RDW: 12.4 % (ref 11.5–15.5)
WBC: 5.8 10*3/uL (ref 4.0–10.5)

## 2021-01-09 LAB — TSH: TSH: 0.65 u[IU]/mL (ref 0.35–4.50)

## 2021-01-09 NOTE — Progress Notes (Signed)
Established Patient Office Visit  Subjective:  Patient ID: Patrick Graham, male    DOB: [birth year]  Age: 35 y.o. MRN: [id number]  CC:  Chief Complaint  Patient presents with   Groin Pain    Patient states he is having some pain in his groin area for a couple days and would like to get std testing . Patient is having problems with bowel movement,    HPI Patrick Graham presents for groin pain and bowel concerns  Groin pain Ongoing for a few months Stable, not worsening L testicle worse than R L testicle will ascend at times, sometimes painless Most frequently occurs close to or at ejaculation  Constipation Ongoing around 1 year Straining, infrequency Diet is heavy in processed foods, trying to add in more fiber Somewhat more consistent in past 1-2 weeks No sudden weight changes. Notes that when he is contipated he has some bloating pain that is relieved by BM Some foods definitely better than others.  STI screening Wants routine screening No acutely concerning symptoms. No new partners  Past Medical History:  Diagnosis Date   ADHD (attention deficit hyperactivity disorder)    diagnosed in middle school.  Previous use of Concerta, Adderall, Focalin   Allergy    Hypertension 07/28/2011   borderline    Past Surgical History:  Procedure Laterality Date   WISDOM TOOTH EXTRACTION  2006    Family History  Problem Relation Age of Onset   Cancer Mother        endometrial cancer   Arthritis Mother        hip osteoarthritis    Social History   Socioeconomic History   Marital status: Significant Other    Spouse name: Not on file   Number of children: 1   Years of education: Not on file   Highest education level: Not on file  Occupational History   Not on file  Tobacco Use   Smoking status: Never   Smokeless tobacco: Never  Vaping Use   Vaping Use: Never used  Substance and Sexual Activity   Alcohol use: Yes    Comment: social   Drug use: No   Sexual  activity: Yes  Other Topics Concern   Not on file  Social History Narrative   Marital status: lives with partner and partner's daughter      Children:  None      Lives: alone; cats      Employment:  [employer]; started in April 2015; likes job. Third shift.      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      Tobacco; none      Alcohol:  Socially; 1 drink per week      Drugs: none      Exercise: hiking a lot in 2018      Seatbelt: 100%; texting none      Sexual activity: total partners = 15; no STDs; condoms 50-75%   Social Determinants of Health   Financial Resource Strain: Not on file  Food Insecurity: Not on file  Transportation Needs: Not on file  Physical Activity: Not on file  Stress: Not on file  Social Connections: Not on file  Intimate Partner Violence: Not on file    Outpatient Medications Prior to Visit  Medication Sig Dispense Refill   amphetamine-dextroamphetamine (ADDERALL) 10 MG tablet Take 10 mg by mouth daily with breakfast.     Cetirizine HCl (ZYRTEC PO) Take by mouth daily.  fluticasone (FLONASE) 50 MCG/ACT nasal spray SHAKE LIQUID AND USE 2 SPRAYS IN EACH NOSTRIL DAILY 16 g 0   ibuprofen (ADVIL,MOTRIN) 200 MG tablet Take 600 mg by mouth every 8 (eight) hours as needed for headache or moderate pain.     ibuprofen (ADVIL,MOTRIN) 600 MG tablet Take 1 tablet (600 mg total) by mouth 3 (three) times daily after meals. 30 tablet 0   lisinopril (ZESTRIL) 10 MG tablet TAKE 1 TABLET(10 MG) BY MOUTH DAILY 90 tablet 0   omeprazole (PRILOSEC) 40 MG capsule TAKE 1 CAPSULE(40 MG) BY MOUTH DAILY 30 capsule 3   temazepam (RESTORIL) 15 MG capsule Take 1 capsule (15 mg total) by mouth at bedtime as needed for sleep. 30 capsule 0   temazepam (RESTORIL) 15 MG capsule Take 1 capsule (15 mg total) by mouth at bedtime as needed for sleep. (Patient taking differently: Take 30 mg by mouth at bedtime as needed for sleep.) 30 capsule 0   temazepam (RESTORIL)  15 MG capsule Take 1 capsule (15 mg total) by mouth at bedtime as needed for sleep. 30 capsule 0   traZODone (DESYREL) 50 MG tablet TAKE 1/2 TO 1 TABLET(25 TO 50 MG) BY MOUTH AT BEDTIME AS NEEDED FOR SLEEP 90 tablet 0   venlafaxine (EFFEXOR) 75 MG tablet Take 75 mg by mouth in the morning, at noon, and at bedtime.     famotidine (PEPCID) 10 MG tablet Take 10 mg by mouth 2 (two) times daily. (Patient not taking: No sig reported)     No facility-administered medications prior to visit.    Allergies  Allergen Reactions   Amoxicillin Nausea And Vomiting    ROS Review of Systems  Constitutional: Negative.   HENT: Negative.    Eyes: Negative.   Respiratory: Negative.    Cardiovascular: Negative.   Gastrointestinal: Negative.   Endocrine: Negative.   Genitourinary:  Positive for testicular pain. Negative for scrotal swelling.  Musculoskeletal: Negative.   Skin: Negative.   Allergic/Immunologic: Negative.   Neurological: Negative.   Psychiatric/Behavioral: Negative.    All other systems reviewed and are negative.    Objective:    Physical Exam Constitutional:      General: He is not in acute distress.    Appearance: Normal appearance. He is normal weight. He is not ill-appearing, toxic-appearing or diaphoretic.  Cardiovascular:     Rate and Rhythm: Normal rate and regular rhythm.     Heart sounds: Normal heart sounds. No murmur heard.   No friction rub. No gallop.  Pulmonary:     Effort: Pulmonary effort is normal. No respiratory distress.     Breath sounds: Normal breath sounds. No stridor. No wheezing, rhonchi or rales.  Chest:     Chest wall: No tenderness.  Abdominal:     General: Abdomen is flat. Bowel sounds are normal.  Neurological:     General: No focal deficit present.     Mental Status: He is alert and oriented to person, place, and time. Mental status is at baseline.  Psychiatric:        Mood and Affect: Mood normal.        Behavior: Behavior normal.         Thought Content: Thought content normal.        Judgment: Judgment normal.    BP 120/76   Pulse 93   Temp 98.3 F (36.8 C) (Temporal)   Resp 18   Ht 5\' 9"  (1.753 m)   Wt 178 lb 12.8 oz (81.1  kg)   SpO2 99%   BMI 26.40 kg/m  Wt Readings from Last 3 Encounters:  01/09/21 178 lb 12.8 oz (81.1 kg)  07/15/20 184 lb 6.4 oz (83.6 kg)  03/01/20 192 lb 6.4 oz (87.3 kg)     There are no preventive care reminders to display for this patient.  There are no preventive care reminders to display for this patient.  Lab Results  Component Value Date   TSH 0.821 11/07/2019   Lab Results  Component Value Date   WBC 5.9 07/15/2020   HGB 15.7 07/15/2020   HCT 44.7 07/15/2020   MCV 88 07/15/2020   PLT 251 07/15/2020   Lab Results  Component Value Date   NA 142 11/07/2019   K 4.1 11/07/2019   CO2 24 11/07/2019   GLUCOSE 88 11/07/2019   BUN 11 11/07/2019   CREATININE 0.67 (L) 11/07/2019   BILITOT 0.4 11/07/2019   ALKPHOS 59 11/07/2019   AST 21 11/07/2019   ALT 30 11/07/2019   PROT 6.9 11/07/2019   ALBUMIN 4.7 11/07/2019   CALCIUM 9.5 11/07/2019   Lab Results  Component Value Date   CHOL 137 11/07/2019   Lab Results  Component Value Date   HDL 49 11/07/2019   Lab Results  Component Value Date   LDLCALC 65 11/07/2019   Lab Results  Component Value Date   TRIG 132 11/07/2019   Lab Results  Component Value Date   CHOLHDL 2.8 11/07/2019   Lab Results  Component Value Date   HGBA1C 4.9 11/07/2019      Assessment & Plan:   Problem List Items Addressed This Visit   None Visit Diagnoses     Routine screening for STI (sexually transmitted infection)    -  Primary   Relevant Orders   Urine cytology ancillary only([facility])   HIV antibody (with reflex)   RPR   Retractile testis       Relevant Orders   Urinalysis   Ambulatory referral to Urology   Easy bruising       Relevant Orders   CBC with Differential/Platelet   TSH   Chronic idiopathic  constipation           No orders of the defined types were placed in this encounter.   Follow-up: No follow-ups on file.   PLAN Discussed ibs, fodmap diet, and psyllium husk/miralax for constipation. No red flags. Return if worsening to improve No evidence of cremasteric hyperreflexia today but will refer to urology for addressing retractile testicle. Sti screening sent, will follow up as warranted Cbc and tsh for constipation Patient encouraged to call clinic with any questions, comments, or concerns.  Janeece Agee, NP

## 2021-01-09 NOTE — Patient Instructions (Addendum)
Mr. Marcott -   Good to see you.  Let me know if things get worse or don't get better.  Check out the info below regarding diet and constipation  I recommend daily psyllium husk (Metamucil) with polyethylene glycol (MiraLax) once daily as needed.  Thank you  Rich     If you have lab work done today you will be contacted with your lab results within the next 2 weeks.  If you have not heard from [place] then please contact us. The fastest way to get your results is to register for My Chart.   IF you received an x-ray today, you will receive an invoice from [facility]. Please contact [facility] at [phone] with questions or concerns regarding your invoice.   IF you received labwork today, you will receive an invoice from [facility]. Please contact [facility] at [phone] with questions or concerns regarding your invoice.   Our billing staff will not be able to assist you with questions regarding bills from these companies.  You will be contacted with the lab results as soon as they are available. The fastest way to get your results is to activate your My Chart account. Instructions are located on the last page of this paperwork. If you have not heard from [place] regarding the results in 2 weeks, please contact this office.

## 2021-01-10 LAB — RPR: RPR Ser Ql: NONREACTIVE

## 2021-01-10 LAB — URINE CYTOLOGY ANCILLARY ONLY
Chlamydia: NEGATIVE
Comment: NEGATIVE
Comment: NEGATIVE
Comment: NORMAL
Neisseria Gonorrhea: NEGATIVE
Trichomonas: NEGATIVE

## 2021-01-10 LAB — HIV ANTIBODY (ROUTINE TESTING W REFLEX): HIV 1&2 Ab, 4th Generation: NONREACTIVE

## 2021-02-25 ENCOUNTER — Other Ambulatory Visit: Payer: Self-pay | Admitting: Registered Nurse

## 2021-02-25 DIAGNOSIS — I10 Essential (primary) hypertension: Secondary | ICD-10-CM

## 2021-03-25 ENCOUNTER — Other Ambulatory Visit: Payer: Self-pay | Admitting: Registered Nurse

## 2021-03-25 DIAGNOSIS — I10 Essential (primary) hypertension: Secondary | ICD-10-CM

## 2021-05-03 ENCOUNTER — Other Ambulatory Visit: Payer: Self-pay | Admitting: Registered Nurse

## 2021-05-18 ENCOUNTER — Other Ambulatory Visit: Payer: Self-pay | Admitting: Registered Nurse

## 2021-05-18 DIAGNOSIS — F5104 Psychophysiologic insomnia: Secondary | ICD-10-CM

## 2021-07-17 ENCOUNTER — Telehealth (INDEPENDENT_AMBULATORY_CARE_PROVIDER_SITE_OTHER): Payer: 59 | Admitting: Physician Assistant

## 2021-07-17 DIAGNOSIS — U071 COVID-19: Secondary | ICD-10-CM | POA: Diagnosis not present

## 2021-07-17 NOTE — Progress Notes (Signed)
Virtual Visit via Video Note  I connected with  [name]  on 07/17/21 at 11:30 AM EST by a video enabled telemedicine application and verified that I am speaking with the correct person using two identifiers.  Location: Patient: home Provider: Nature conservation officer at [employer] Persons present: Patient and myself   I discussed the limitations of evaluation and management by telemedicine and the availability of in person appointments. The patient expressed understanding and agreed to proceed.   History of Present Illness:  Chief complaint: COVID-19 positive at home and also PCR positive Symptom onset: 07/10/21 Pertinent positives: Congestion, sinus headaches, body aches, fatigue, minimal diarrhea Pertinent negatives: Cough, fever, chills, n/v, abdominal pain Treatments tried: Sudafed, Advil Cold & Flu Vaccine status: First two COVID-19 vaccines & one booster  Sick exposure: Exposure at work    Observations/Objective:   Gen: Awake, alert, no acute distress, some congestion  Resp: Breathing is even and non-labored Psych: calm/pleasant demeanor Neuro: Alert and Oriented x 3, + facial symmetry, speech is clear.   Assessment and Plan:  1. COVID-19 Diagnosis confirmed via home antigen test & PCR.  Patient is currently having mild symptoms.  We discussed current algorithm recommendations for prescribing outpatient antivirals.  He is outside of the 5 day window for antiviral therapy. Risks versus benefits discussed.  Advised self-isolation at home for the next 5 days and then masking around others for at least an additional 5 days.  Treat supportively at this time including sleeping prone, deep breathing exercises, pushing fluids, walking every few hours, vitamins C and D, and Tylenol or ibuprofen as needed.  The patient understands that COVID-19 illness can wax and wane.  Should the symptoms acutely worsen or patient starts to experience sudden shortness of breath, chest  pain, severe weakness, the patient will go straight to the emergency department.  Also advised home pulse oximetry monitoring and for any reading consistently under 92%, should also report to the emergency department.  The patient will continue to keep [place] updated.    Follow Up Instructions:    I discussed the assessment and treatment plan with the patient. The patient was provided an opportunity to ask questions and all were answered. The patient agreed with the plan and demonstrated an understanding of the instructions.   The patient was advised to call back or seek an in-person evaluation if the symptoms worsen or if the condition fails to improve as anticipated.  Bethzaida Boord M Ziggy Chanthavong, PA-C

## 2021-08-05 ENCOUNTER — Ambulatory Visit: Payer: 59 | Admitting: Registered Nurse

## 2021-08-25 ENCOUNTER — Other Ambulatory Visit: Payer: Self-pay | Admitting: Registered Nurse

## 2021-08-25 DIAGNOSIS — F5104 Psychophysiologic insomnia: Secondary | ICD-10-CM

## 2021-09-25 ENCOUNTER — Other Ambulatory Visit: Payer: Self-pay | Admitting: Registered Nurse

## 2021-11-28 ENCOUNTER — Other Ambulatory Visit: Payer: Self-pay | Admitting: Registered Nurse

## 2021-11-28 DIAGNOSIS — F5104 Psychophysiologic insomnia: Secondary | ICD-10-CM

## 2022-02-01 ENCOUNTER — Other Ambulatory Visit: Payer: Self-pay | Admitting: Registered Nurse

## 2022-03-03 ENCOUNTER — Other Ambulatory Visit: Payer: Self-pay | Admitting: Registered Nurse

## 2022-03-03 DIAGNOSIS — F5104 Psychophysiologic insomnia: Secondary | ICD-10-CM

## 2023-08-18 ENCOUNTER — Ambulatory Visit (HOSPITAL_BASED_OUTPATIENT_CLINIC_OR_DEPARTMENT_OTHER): Payer: 59 | Admitting: Family Medicine

## 2023-09-14 ENCOUNTER — Encounter (HOSPITAL_BASED_OUTPATIENT_CLINIC_OR_DEPARTMENT_OTHER): Payer: Self-pay | Admitting: Family Medicine

## 2023-09-14 ENCOUNTER — Ambulatory Visit (HOSPITAL_BASED_OUTPATIENT_CLINIC_OR_DEPARTMENT_OTHER): Payer: 59 | Admitting: Family Medicine

## 2023-09-14 VITALS — BP 146/98 | HR 78 | Ht 70.0 in | Wt 219.0 lb

## 2023-09-14 DIAGNOSIS — Z7689 Persons encountering health services in other specified circumstances: Secondary | ICD-10-CM

## 2023-09-14 DIAGNOSIS — F840 Autistic disorder: Secondary | ICD-10-CM | POA: Insufficient documentation

## 2023-09-14 DIAGNOSIS — I10 Essential (primary) hypertension: Secondary | ICD-10-CM

## 2023-09-14 DIAGNOSIS — K219 Gastro-esophageal reflux disease without esophagitis: Secondary | ICD-10-CM | POA: Diagnosis not present

## 2023-09-14 MED ORDER — OMEPRAZOLE 40 MG PO CPDR: 40.0000 mg | DELAYED_RELEASE_CAPSULE | Freq: Every day | ORAL | 3 refills | Status: AC

## 2023-09-14 MED ORDER — LOSARTAN POTASSIUM 25 MG PO TABS: 25.0000 mg | ORAL_TABLET | Freq: Every day | ORAL | 3 refills | Status: DC

## 2023-09-14 NOTE — Progress Notes (Unsigned)
 New Patient Office Visit  Subjective:   Patrick Graham 11-Oct-1985 09/14/2023  Chief Complaint  Patient presents with   New Patient (Initial Visit)    Patient is here to get established with the practice. Denies any main concerns for today's visit.    HPI: Patrick Graham presents today to establish care at [facility] at [facility]. Introduced to Publishing rights manager role and practice setting.  All questions answered.   Last PCP: *** Last annual physical: *** Concerns: See below    HYPERTENSION: [name] presents for the medical management of hypertension.  Patient's current hypertension medication regimen is: Lisinopril - patient states he has been out of medication for approx. 3 years.  Patient is not currently taking prescribed medications for HTN.  Patient is *** regularly keeping a check on BP at home.  Adhering to low sodium diet: *** Exercising Regularly: *** Denies dizziness, CP, SHOB, vision changes.    Reports intermittent headaches- usually when sleep is impaired.  BP Readings from Last 3 Encounters:  09/14/23 (!) 140/106  01/09/21 120/76  07/15/20 (!) 144/84    ADHD;    GERD: [name] presents for the medical management of GERD.  Current medication: *** Well controlled: ***   Heartburn frequency: *** Heartburn duration: *** Alleviatiating factors:  *** Aggravating factors: *** Previous GERD medications: *** Antacid use frequency:  ***  Abdominal pain: *** Dysphagia:  Nausea/Vomiting: *** Hematemesis: ***  Blood in stool: *** Alcohol Use: ***  Recent EGD: ***   The following portions of the patient's history were reviewed and updated as appropriate: past medical history, past surgical history, family history, social history, allergies, medications, and problem list.   Patient Active Problem List   Diagnosis Date Noted   Autism spectrum disorder 09/14/2023   Depression 11/22/2018    Essential hypertension, benign 06/24/2014   Attention-deficit hyperactivity disorder, predominantly hyperactive type 06/24/2014   Past Medical History:  Diagnosis Date   ADHD (attention deficit hyperactivity disorder)    diagnosed in middle school.  Previous use of Concerta, Adderall, Focalin   Allergy    Depression    GERD (gastroesophageal reflux disease)    Hypertension 07/28/2011   borderline   Past Surgical History:  Procedure Laterality Date   WISDOM TOOTH EXTRACTION  2006   Family History  Problem Relation Age of Onset   Cancer Mother        endometrial cancer   Arthritis Mother        hip osteoarthritis   Social History   Socioeconomic History   Marital status: Significant Other    Spouse name: Not on file   Number of children: 1   Years of education: Not on file   Highest education level: Bachelor's degree (e.g., BA, AB, BS)  Occupational History   Not on file  Tobacco Use   Smoking status: Never   Smokeless tobacco: Never  Vaping Use   Vaping status: Never Used  Substance and Sexual Activity   Alcohol use: Yes    Comment: social   Drug use: No   Sexual activity: Yes    Birth control/protection: Condom  Other Topics Concern   Not on file  Social History Narrative   Marital status: lives with partner and partner's daughter      Children:  None      Lives: alone; cats      Employment:  [employer]; started in April 2015; likes job. Third shift.  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      Tobacco; none      Alcohol:  Socially; 1 drink per week      Drugs: none      Exercise: hiking a lot in 2018      Seatbelt: 100%; texting none      Sexual activity: total partners = 15; no STDs; condoms 50-75%   Social Drivers of Corporate investment banker Strain: Low Risk  (08/17/2023)   Overall Financial Resource Strain (CARDIA)    Difficulty of Paying Living Expenses: Not very hard  Food Insecurity: No Food Insecurity  (08/17/2023)   Hunger Vital Sign    Worried About Running Out of Food in the Last Year: Never true    Ran Out of Food in the Last Year: Never true  Transportation Needs: No Transportation Needs (08/17/2023)   PRAPARE - Administrator, Civil Service (Medical): No    Lack of Transportation (Non-Medical): No  Physical Activity: Insufficiently Active (08/17/2023)   Exercise Vital Sign    Days of Exercise per Week: 2 days    Minutes of Exercise per Session: 20 min  Stress: Stress Concern Present (08/17/2023)   [employer] of Occupational Health - Occupational Stress Questionnaire    Feeling of Stress : To some extent  Social Connections: Socially Isolated (08/17/2023)   Social Connection and Isolation Panel [NHANES]    Frequency of Communication with Friends and Family: Three times a week    Frequency of Social Gatherings with Friends and Family: Twice a week    Attends Religious Services: Never    Database administrator or Organizations: No    Attends Engineer, structural: Not on file    Marital Status: Never married  Intimate Partner Violence: Not At Risk (09/14/2023)   Humiliation, Afraid, Rape, and Kick questionnaire    Fear of Current or Ex-Partner: No    Emotionally Abused: No    Physically Abused: No    Sexually Abused: No   Outpatient Medications Prior to Visit  Medication Sig Dispense Refill   buPROPion (WELLBUTRIN XL) 300 MG 24 hr tablet Take 300 mg by mouth daily.     Cetirizine HCl (ZYRTEC PO) Take by mouth daily.     dexmethylphenidate (FOCALIN) 10 MG tablet Take 10 mg by mouth 2 (two) times daily.     fluticasone (FLONASE) 50 MCG/ACT nasal spray SHAKE LIQUID AND USE 2 SPRAYS IN EACH NOSTRIL DAILY 16 g 0   guanFACINE (TENEX) 2 MG tablet Take 2 mg by mouth at bedtime.     ibuprofen (ADVIL,MOTRIN) 200 MG tablet Take 600 mg by mouth every 8 (eight) hours as needed for headache or moderate pain.     omeprazole (PRILOSEC) 40 MG capsule TAKE 1 CAPSULE(40  MG) BY MOUTH DAILY 30 capsule 3   temazepam (RESTORIL) 15 MG capsule Take 1 capsule (15 mg total) by mouth at bedtime as needed for sleep. 30 capsule 0   amphetamine-dextroamphetamine (ADDERALL) 10 MG tablet Take 10 mg by mouth daily with breakfast. Patient stated taking 30 mg daily     fluvoxaMINE (LUVOX) 50 MG tablet Take 50 mg by mouth at bedtime.     ibuprofen (ADVIL,MOTRIN) 600 MG tablet Take 1 tablet (600 mg total) by mouth 3 (three) times daily after meals. 30 tablet 0   lisinopril (ZESTRIL) 10 MG tablet TAKE 1 TABLET(10 MG) BY MOUTH DAILY 30 tablet 0   temazepam (RESTORIL) 15 MG capsule Take  1 capsule (15 mg total) by mouth at bedtime as needed for sleep. (Patient taking differently: Take 30 mg by mouth at bedtime as needed for sleep.) 30 capsule 0   temazepam (RESTORIL) 15 MG capsule Take 1 capsule (15 mg total) by mouth at bedtime as needed for sleep. 30 capsule 0   traZODone (DESYREL) 50 MG tablet TAKE 1/2 TO 1 TABLET(25 TO 50 MG) BY MOUTH AT BEDTIME AS NEEDED FOR SLEEP 90 tablet 0   venlafaxine (EFFEXOR) 75 MG tablet Take 75 mg by mouth in the morning, at noon, and at bedtime. (Patient not taking: Reported on 07/17/2021)     No facility-administered medications prior to visit.   Allergies  Allergen Reactions   Amoxicillin Nausea And Vomiting    ROS: A complete ROS was performed with pertinent positives/negatives noted in the HPI. The remainder of the ROS are negative.   Objective:   Today's Vitals   09/14/23 1514  BP: (!) 140/106  Pulse: 78  SpO2: 99%  Weight: 219 lb (99.3 kg)  Height: 5\' 10"  (1.778 m)    GENERAL: Well-appearing, in NAD. Well nourished.  SKIN: Pink, warm and dry. No rash, lesion, ulceration, or ecchymoses.  Head: Normocephalic. NECK: Trachea midline. Full ROM w/o pain or tenderness. No lymphadenopathy.  EARS: Tympanic membranes are intact, translucent without bulging and without drainage. Appropriate landmarks visualized.  EYES: Conjunctiva clear  without exudates. EOMI, PERRL, no drainage present.  NOSE: Septum midline w/o deformity. Nares patent, mucosa pink and non-inflamed w/o drainage. No sinus tenderness.  THROAT: Uvula midline. Oropharynx clear. Tonsils non-inflamed without exudate. Mucous membranes pink and moist.  RESPIRATORY: Chest wall symmetrical. Respirations even and non-labored. Breath sounds clear to auscultation bilaterally.  CARDIAC: S1, S2 present, regular rate and rhythm without murmur or gallops. Peripheral pulses 2+ bilaterally.  MSK: Muscle tone and strength appropriate for age. Joints w/o tenderness, redness, or swelling.  EXTREMITIES: Without clubbing, cyanosis, or edema.  NEUROLOGIC: No motor or sensory deficits. Steady, even gait. C2-C12 intact.  PSYCH/MENTAL STATUS: Alert, oriented x 3. Cooperative, appropriate mood and affect.    Health Maintenance Due  Topic Date Due   Hepatitis C Screening  Never done    No results found for any visits on 09/14/23.     Assessment & Plan:  There are no diagnoses linked to this encounter.    Patient to reach out to office if new, worrisome, or unresolved symptoms arise or if no improvement in patient's condition. Patient verbalized understanding and is agreeable to treatment plan. All questions answered to patient's satisfaction.    No follow-ups on file.    Hilbert Bible, [place]

## 2023-09-14 NOTE — Patient Instructions (Signed)
 Start with 1/2 tablet of Losartan x 1 week and then if Blood pressure is not less than 130/80, increase to 1 tablet daily. Continue to monitor Blood Pressure regularly

## 2023-09-15 DIAGNOSIS — K219 Gastro-esophageal reflux disease without esophagitis: Secondary | ICD-10-CM | POA: Insufficient documentation

## 2023-10-19 ENCOUNTER — Telehealth (HOSPITAL_BASED_OUTPATIENT_CLINIC_OR_DEPARTMENT_OTHER): Payer: Self-pay | Admitting: *Deleted

## 2023-10-19 NOTE — Telephone Encounter (Signed)
 LVM to let pt know appt 4/3 has been moved to 5/7 with [employer]

## 2023-10-28 ENCOUNTER — Encounter (HOSPITAL_BASED_OUTPATIENT_CLINIC_OR_DEPARTMENT_OTHER): Payer: 59 | Admitting: Family Medicine

## 2023-12-01 ENCOUNTER — Encounter (HOSPITAL_BASED_OUTPATIENT_CLINIC_OR_DEPARTMENT_OTHER): Admitting: Family Medicine

## 2023-12-29 ENCOUNTER — Other Ambulatory Visit (HOSPITAL_BASED_OUTPATIENT_CLINIC_OR_DEPARTMENT_OTHER): Payer: Self-pay | Admitting: Family Medicine

## 2023-12-29 DIAGNOSIS — I10 Essential (primary) hypertension: Secondary | ICD-10-CM

## 2024-02-25 ENCOUNTER — Encounter (HOSPITAL_BASED_OUTPATIENT_CLINIC_OR_DEPARTMENT_OTHER): Payer: Self-pay | Admitting: Family Medicine

## 2024-02-25 ENCOUNTER — Ambulatory Visit (HOSPITAL_BASED_OUTPATIENT_CLINIC_OR_DEPARTMENT_OTHER): Admitting: Family Medicine

## 2024-02-25 ENCOUNTER — Other Ambulatory Visit (HOSPITAL_COMMUNITY)
Admission: RE | Admit: 2024-02-25 | Discharge: 2024-02-25 | Disposition: A | Discharge status: Home / Self Care | Source: Ambulatory Visit | Attending: Family Medicine | Admitting: Family Medicine

## 2024-02-25 VITALS — BP 132/86 | HR 70 | Ht 70.0 in | Wt 217.0 lb

## 2024-02-25 DIAGNOSIS — Z0000 Encounter for general adult medical examination without abnormal findings: Secondary | ICD-10-CM

## 2024-02-25 DIAGNOSIS — Z13220 Encounter for screening for lipoid disorders: Secondary | ICD-10-CM | POA: Diagnosis not present

## 2024-02-25 DIAGNOSIS — Z202 Contact with and (suspected) exposure to infections with a predominantly sexual mode of transmission: Secondary | ICD-10-CM | POA: Diagnosis present

## 2024-02-25 DIAGNOSIS — Z114 Encounter for screening for human immunodeficiency virus [HIV]: Secondary | ICD-10-CM

## 2024-02-25 DIAGNOSIS — I10 Essential (primary) hypertension: Secondary | ICD-10-CM

## 2024-02-25 DIAGNOSIS — Z23 Encounter for immunization: Secondary | ICD-10-CM

## 2024-02-25 DIAGNOSIS — Z1159 Encounter for screening for other viral diseases: Secondary | ICD-10-CM

## 2024-02-25 NOTE — Progress Notes (Signed)
 Subjective:   Patrick Graham Mar 12, 1986 02/25/2024  CC: Chief Complaint  Patient presents with   Annual Exam    Patient is here today for his physical. Denies any main concerns for today's visit.    HPI: Patrick Graham is a 38 y.o. male who presents for a routine health maintenance exam.  Labs collected at time of visit.   HYPERTENSION: Patrick Graham presents for the medical management of hypertension.  Patient's current hypertension medication regimen is: Losartan  12.5mg   Patient is  currently taking prescribed medications for HTN.  Patient is  regularly keeping a check on BP at home. States is has been well controlled when checking.  Adhering to low sodium diet: Yes Exercising Regularly: Yes Denies headache, dizziness, CP, SHOB, vision changes.    BP Readings from Last 3 Encounters:  02/25/24 132/86  09/14/23 (!) 146/98  01/09/21 120/76     HEALTH SCREENINGS: - Vision Screening: up to date - Dental Visits: Recommended - Testicular Exam: Declined - STD Screening: Ordered today - PSA (50+): Not applicable  No results found for: PSA1, PSA   - Colonoscopy (45+): Not applicable  Discussed with patient purpose of the colonoscopy is to detect colon cancer at curable precancerous or early stages  - AAA Screening: Not applicable  Men age 20-75 who have ever smoked - Lung Cancer screening with low-dose CT: Not applicable-  Adults age 71-80 who are current cigarette smokers or quit within the last 15 years. Must have 20 pack year history.   Depression and Anxiety Screen done today and results listed below:     02/25/2024    8:14 AM 09/14/2023    3:18 PM 01/09/2021    1:05 PM 07/15/2020    9:00 AM 03/01/2020    8:59 AM  Depression screen PHQ 2/9  Decreased Interest 0 1 0 1 1  Down, Depressed, Hopeless 0 0 0 1 1  PHQ - 2 Score 0 1 0 2 2  Altered sleeping 1 1  2 3   Tired, decreased energy 1 1  1 1   Change in appetite 0 2  3 3   Feeling bad or failure about  yourself  0 0  0 0  Trouble concentrating 0 1  3 2   Moving slowly or fidgety/restless 0 3  0 0  Suicidal thoughts 0 0  0 0  PHQ-9 Score 2 9  11 11   Difficult doing work/chores Not difficult at all Somewhat difficult  Somewhat difficult Somewhat difficult      02/25/2024    8:14 AM 09/14/2023    3:20 PM  GAD 7 : Generalized Anxiety Score  Nervous, Anxious, on Edge 0 1  Control/stop worrying 0 0  Worry too much - different things 0 0  Trouble relaxing 0 0  Restless 0 1  Easily annoyed or irritable 0 1  Afraid - awful might happen 0 0  Total GAD 7 Score 0 3  Anxiety Difficulty Not difficult at all Somewhat difficult    IMMUNIZATIONS:  - Tdap: Tetanus vaccination status reviewed: last tetanus booster within 10 years. - Influenza: Postponed to flu season - Pneumovax: Not applicable - Prevnar: Not applicable - Shingrix vaccine (50+): Not applicable - HPV Vaccine: Desiring to complete series    Past medical history, surgical history, medications, allergies, family history and social history reviewed with patient today and changes made to appropriate areas of the chart.   Past Medical History:  Diagnosis Date   ADHD (attention deficit hyperactivity disorder)  diagnosed in middle school.  Previous use of Concerta , Adderall, Focalin   Allergy    Depression    GERD (gastroesophageal reflux disease)    Hypertension 07/28/2011   borderline    Past Surgical History:  Procedure Laterality Date   WISDOM TOOTH EXTRACTION  2006    Current Outpatient Medications on File Prior to Visit  Medication Sig   buPROPion (WELLBUTRIN XL) 300 MG 24 hr tablet Take 300 mg by mouth daily.   Cetirizine HCl (ZYRTEC PO) Take by mouth daily.   dexmethylphenidate (FOCALIN) 10 MG tablet Take 10 mg by mouth 2 (two) times daily.   fluticasone  (FLONASE ) 50 MCG/ACT nasal spray SHAKE LIQUID AND USE 2 SPRAYS IN EACH NOSTRIL DAILY   guanFACINE (TENEX) 2 MG tablet Take 2 mg by mouth at bedtime.   ibuprofen   (ADVIL ,MOTRIN ) 200 MG tablet Take 600 mg by mouth every 8 (eight) hours as needed for headache or moderate pain.   losartan  (COZAAR ) 25 MG tablet TAKE 1 TABLET(25 MG) BY MOUTH DAILY   omeprazole  (PRILOSEC) 40 MG capsule Take 1 capsule (40 mg total) by mouth daily.   temazepam  (RESTORIL ) 15 MG capsule Take 1 capsule (15 mg total) by mouth at bedtime as needed for sleep.   No current facility-administered medications on file prior to visit.    Allergies  Allergen Reactions   Amoxicillin Nausea And Vomiting     Social History   Socioeconomic History   Marital status: Significant Other    Spouse name: Not on file   Number of children: 1   Years of education: Not on file   Highest education level: Bachelor's degree (e.g., BA, AB, BS)  Occupational History   Not on file  Tobacco Use   Smoking status: Never   Smokeless tobacco: Never  Vaping Use   Vaping status: Never Used  Substance and Sexual Activity   Alcohol use: Yes    Comment: social   Drug use: No   Sexual activity: Yes    Birth control/protection: Condom  Other Topics Concern   Not on file  Social History Narrative   Marital status: lives with partner and partner's daughter      Children:  None      Lives: alone; cats      Employment:  [employer]; started in April 2015; likes job. Third shift.      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      Tobacco; none      Alcohol:  Socially; 1 drink per week      Drugs: none      Exercise: hiking a lot in 2018      Seatbelt: 100%; texting none      Sexual activity: total partners = 15; no STDs; condoms 50-75%   Social Drivers of Corporate investment banker Strain: Low Risk  (08/17/2023)   Overall Financial Resource Strain (CARDIA)    Difficulty of Paying Living Expenses: Not very hard  Food Insecurity: No Food Insecurity (08/17/2023)   Hunger Vital Sign    Worried About Running Out of Food in the Last Year: Never true    Ran Out of Food in  the Last Year: Never true  Transportation Needs: No Transportation Needs (08/17/2023)   PRAPARE - Administrator, Civil Service (Medical): No    Lack of Transportation (Non-Medical): No  Physical Activity: Insufficiently Active (08/17/2023)   Exercise Vital Sign    Days of Exercise per Week: 2  days    Minutes of Exercise per Session: 20 min  Stress: Stress Concern Present (08/17/2023)   [employer] of Occupational Health - Occupational Stress Questionnaire    Feeling of Stress : To some extent  Social Connections: Socially Isolated (08/17/2023)   Social Connection and Isolation Panel    Frequency of Communication with Friends and Family: Three times a week    Frequency of Social Gatherings with Friends and Family: Twice a week    Attends Religious Services: Never    Database administrator or Organizations: No    Attends Engineer, structural: Not on file    Marital Status: Never married  Intimate Partner Violence: Not At Risk (09/14/2023)   Humiliation, Afraid, Rape, and Kick questionnaire    Fear of Current or Ex-Partner: No    Emotionally Abused: No    Physically Abused: No    Sexually Abused: No   Social History   Tobacco Use  Smoking Status Never  Smokeless Tobacco Never   Social History   Substance and Sexual Activity  Alcohol Use Yes   Comment: social     Family History  Problem Relation Age of Onset   Cancer Mother        endometrial cancer   Arthritis Mother        hip osteoarthritis     ROS: Denies fever, fatigue, unexplained weight loss/gain, CP, SHOB, and palpatitations. Denies neurological deficits, gastrointestinal and/or genitourinary complaints, and skin changes.   Objective:   Today's Vitals   02/25/24 0811 02/25/24 0835  BP: (!) 142/90 132/86  Pulse: 70   SpO2: 98%   Weight: 217 lb (98.4 kg)   Height: 5' 10 (1.778 m)     GENERAL APPEARANCE: Well-appearing, in NAD. Well nourished.  SKIN: Pink, warm and dry. Turgor  normal. No rash, lesion, ulceration, or ecchymoses. Hair evenly distributed.  HEENT: HEAD: Normocephalic.  EYES: PERRLA. EOMI. Lids intact w/o defect. Sclera white, Conjunctiva pink w/o exudate.  EARS: External ear w/o redness, swelling, masses or lesions. EAC clear. TM's intact, translucent w/o bulging, appropriate landmarks visualized. Appropriate acuity to conversational tones.  NOSE: Septum midline w/o deformity. Nares patent, mucosa pink and non-inflamed w/o drainage. No sinus tenderness.  THROAT: Uvula midline. Oropharynx clear. Tonsils non-inflamed w/o exudate. Oral mucosa pink and moist.  NECK: Supple, Trachea midline. Full ROM w/o pain or tenderness. No lymphadenopathy. Thyroid  non-tender w/o enlargement or palpable masses.  RESPIRATORY: Chest wall symmetrical w/o masses. Respirations even and non-labored. Breath sounds clear to auscultation bilaterally. No wheezes, rales, rhonchi, or crackles. CARDIAC: S1, S2 present, regular rate and rhythm. No gallops, murmurs, rubs, or clicks. PMI w/o lifts, heaves, or thrills. No carotid bruits. Capillary refill <2 seconds. Peripheral pulses 2+ bilaterally. GI: Abdomen soft w/o distention. Normoactive bowel sounds. No palpable masses or tenderness. No guarding or rebound tenderness. Liver and spleen w/o tenderness or enlargement. No CVA tenderness.  GU: Pt deferred exam. MSK: Muscle tone and strength appropriate for age, w/o atrophy or abnormal movement. EXTREMITIES: Active ROM intact, w/o tenderness, crepitus, or contracture. No obvious joint deformities or effusions. No clubbing, edema, or cyanosis.  NEUROLOGIC: CN's II-XII intact. Motor strength symmetrical with no obvious weakness. No sensory deficits. DTR 2+ symmetric bilaterally. Steady, even gait.  PSYCH/MENTAL STATUS: Alert, oriented x 3. Cooperative, appropriate mood and affect.     Assessment & Plan:  1. Annual physical exam (Primary) Discussed preventative screenings, vaccines, and  healthy lifestyle with patient.  Will obtain labs today.  -  CBC with Differential/Platelet - Comprehensive metabolic panel with GFR  2. Essential hypertension, benign Controlled. Continue Losartan  12.5mg  daily. Discussed increasing to 25mg  if BP consistently over 130/80.   3. Screening for lipid disorders - Lipid panel  4. Encounter for assessment of sexually transmitted disease exposure Asymptomatic, patient requesting screening.  - RPR - Urine cytology ancillary only  5. Encounter for screening for human immunodeficiency virus (HIV) Asymptomatic, patient requesting screening.  - HIV Antibody (routine testing w rflx)  6. Encounter for hepatitis C screening test for low risk patient Asymptomatic, patient requesting screening.  - Hepatitis C antibody  7. Immunization due - HPV 9-valent vaccine,Recombinat    Orders Placed This Encounter  Procedures   HPV 9-valent vaccine,Recombinat   CBC with Differential/Platelet   Comprehensive metabolic panel with GFR   Lipid panel   Hepatitis C antibody   HIV Antibody (routine testing w rflx)   RPR    PATIENT COUNSELING: - Encouraged to adjust caloric intake to maintain or achieve ideal body weight, to reduce intake of dietary saturated fat and total fat, to limit sodium intake by avoiding high sodium foods and not adding table salt, and to maintain adequate dietary potassium and calcium preferably from fresh fruits, vegetables, and low-fat dairy products.   - Advised to avoid cigarette smoking. - Discussed with the patient that most people either abstain from alcohol or drink within safe limits (<=14/week and <=4 drinks/occasion for males, <=7/weeks and <= 3 drinks/occasion for females) and that the risk for alcohol disorders and other health effects rises proportionally with the number of drinks per week and how often a drinker exceeds daily limits. - Discussed cessation/primary prevention of drug use and availability of treatment for  abuse.   - Stressed the importance of regular exercise - Injury prevention: Discussed safety belts, safety helmets, smoke detector, smoking near bedding or upholstery.  - Dental health: Discussed importance of regular tooth brushing, flossing, and dental visits.  - Sexuality: Discussed sexually transmitted diseases, partner selection, use of condoms, avoidance of unintended pregnancy  and contraceptive alternatives.   NEXT PREVENTATIVE PHYSICAL DUE IN 1 YEAR.  Return for 2 months 2nd HPV Vaccine; 1 year Annual Exam .  Patient to reach out to office if new, worrisome, or unresolved symptoms arise or if no improvement in patient's condition. Patient verbalized understanding and is agreeable to treatment plan. All questions answered to patient's satisfaction.    Thersia Schuyler Stark, [place]

## 2024-02-25 NOTE — Patient Instructions (Signed)

## 2024-02-26 LAB — CBC WITH DIFFERENTIAL/PLATELET
Basophils Absolute: 0.1 x10E3/uL (ref 0.0–0.2)
Basos: 1 %
EOS (ABSOLUTE): 0.6 x10E3/uL — ABNORMAL HIGH (ref 0.0–0.4)
Eos: 8 %
Hematocrit: 48.8 % (ref 37.5–51.0)
Hemoglobin: 15.9 g/dL (ref 13.0–17.7)
Immature Grans (Abs): 0 x10E3/uL (ref 0.0–0.1)
Immature Granulocytes: 0 %
Lymphocytes Absolute: 2.1 x10E3/uL (ref 0.7–3.1)
Lymphs: 30 %
MCH: 30.2 pg (ref 26.6–33.0)
MCHC: 32.6 g/dL (ref 31.5–35.7)
MCV: 93 fL (ref 79–97)
Monocytes Absolute: 0.6 x10E3/uL (ref 0.1–0.9)
Monocytes: 8 %
Neutrophils Absolute: 3.6 x10E3/uL (ref 1.4–7.0)
Neutrophils: 53 %
Platelets: 286 x10E3/uL (ref 150–450)
RBC: 5.26 x10E6/uL (ref 4.14–5.80)
RDW: 12.1 % (ref 11.6–15.4)
WBC: 6.9 x10E3/uL (ref 3.4–10.8)

## 2024-02-26 LAB — COMPREHENSIVE METABOLIC PANEL WITH GFR
ALT: 18 IU/L (ref 0–44)
AST: 14 IU/L (ref 0–40)
Albumin: 4.6 g/dL (ref 4.1–5.1)
Alkaline Phosphatase: 95 IU/L (ref 44–121)
BUN/Creatinine Ratio: 12 (ref 9–20)
BUN: 10 mg/dL (ref 6–20)
Bilirubin Total: 0.4 mg/dL (ref 0.0–1.2)
CO2: 22 mmol/L (ref 20–29)
Calcium: 9.9 mg/dL (ref 8.7–10.2)
Chloride: 102 mmol/L (ref 96–106)
Creatinine, Ser: 0.81 mg/dL (ref 0.76–1.27)
Globulin, Total: 2.4 g/dL (ref 1.5–4.5)
Glucose: 115 mg/dL — ABNORMAL HIGH (ref 70–99)
Potassium: 4.5 mmol/L (ref 3.5–5.2)
Sodium: 140 mmol/L (ref 134–144)
Total Protein: 7 g/dL (ref 6.0–8.5)
eGFR: 116 mL/min/1.73 (ref >59)

## 2024-02-26 LAB — LIPID PANEL
Chol/HDL Ratio: 3.1 ratio (ref 0.0–5.0)
Cholesterol, Total: 141 mg/dL (ref 100–199)
HDL: 45 mg/dL (ref >39)
LDL Chol Calc (NIH): 66 mg/dL (ref 0–99)
Triglycerides: 182 mg/dL — ABNORMAL HIGH (ref 0–149)
VLDL Cholesterol Cal: 30 mg/dL (ref 5–40)

## 2024-02-26 LAB — HIV ANTIBODY (ROUTINE TESTING W REFLEX): HIV Screen 4th Generation wRfx: NONREACTIVE

## 2024-02-26 LAB — RPR: RPR Ser Ql: NONREACTIVE

## 2024-02-26 LAB — HEPATITIS C ANTIBODY: Hep C Virus Ab: NONREACTIVE

## 2024-02-28 ENCOUNTER — Ambulatory Visit (HOSPITAL_BASED_OUTPATIENT_CLINIC_OR_DEPARTMENT_OTHER): Payer: Self-pay | Admitting: Family Medicine

## 2024-02-28 LAB — URINE CYTOLOGY ANCILLARY ONLY
Chlamydia: NEGATIVE
Comment: NEGATIVE
Comment: NEGATIVE
Comment: NORMAL
Neisseria Gonorrhea: NEGATIVE
Trichomonas: NEGATIVE

## 2024-02-28 NOTE — Progress Notes (Signed)
 Hi Pranit,  Your blood counts are stable. Your glucose was slightly elevated. I do not see a history of prediabetes in your chart. Your electrolytes, kidney and liver function is stable. Your triglycerides were slightly elevated and will correlate with increased glucose (sugar) levels. Your STI testing is normal. I believe you had eaten in the morning prior to your labs. If not, please let me know and I will add on an A1C to check for prediabetes. We will continue to check these annually.

## 2024-02-28 NOTE — Progress Notes (Signed)
 Urine cytology is negative.

## 2024-07-18 ENCOUNTER — Other Ambulatory Visit (HOSPITAL_BASED_OUTPATIENT_CLINIC_OR_DEPARTMENT_OTHER): Payer: Self-pay | Admitting: Family Medicine

## 2024-07-18 DIAGNOSIS — I10 Essential (primary) hypertension: Secondary | ICD-10-CM
